# Patient Record
Sex: Female | Born: 1960
Health system: Southern US, Community
[De-identification: ages and names within clinical notes are randomized; demographics above are authoritative.]

## PROBLEM LIST (undated history)

## (undated) DIAGNOSIS — I10 Essential (primary) hypertension: Secondary | ICD-10-CM

## (undated) DIAGNOSIS — E785 Hyperlipidemia, unspecified: Secondary | ICD-10-CM

## (undated) DIAGNOSIS — N6320 Unspecified lump in the left breast, unspecified quadrant: Secondary | ICD-10-CM

## (undated) DIAGNOSIS — T7840XA Allergy, unspecified, initial encounter: Secondary | ICD-10-CM

## (undated) DIAGNOSIS — E119 Type 2 diabetes mellitus without complications: Secondary | ICD-10-CM

## (undated) HISTORY — PX: TOTAL ABDOMINAL HYSTERECTOMY: SHX209

## (undated) HISTORY — DX: Allergy, unspecified, initial encounter: T78.40XA

## (undated) HISTORY — DX: Hyperlipidemia, unspecified: E78.5

## (undated) HISTORY — PX: TUBAL LIGATION: SHX77

## (undated) HISTORY — DX: Essential (primary) hypertension: I10

---

## 2003-11-02 ENCOUNTER — Ambulatory Visit (HOSPITAL_COMMUNITY): Admission: RE | Admit: 2003-11-02 | Discharge: 2003-11-02 | Payer: Self-pay | Admitting: Obstetrics and Gynecology

## 2004-11-05 ENCOUNTER — Inpatient Hospital Stay (HOSPITAL_COMMUNITY): Admission: RE | Admit: 2004-11-05 | Discharge: 2004-11-07 | Payer: Self-pay | Admitting: Obstetrics and Gynecology

## 2004-12-04 ENCOUNTER — Ambulatory Visit (HOSPITAL_COMMUNITY): Admission: RE | Admit: 2004-12-04 | Discharge: 2004-12-04 | Payer: Self-pay | Admitting: Obstetrics and Gynecology

## 2006-01-16 ENCOUNTER — Ambulatory Visit (HOSPITAL_COMMUNITY): Admission: RE | Admit: 2006-01-16 | Discharge: 2006-01-16 | Payer: Self-pay | Admitting: Obstetrics and Gynecology

## 2006-08-02 ENCOUNTER — Emergency Department (HOSPITAL_COMMUNITY): Admission: EM | Admit: 2006-08-02 | Discharge: 2006-08-02 | Payer: Self-pay | Admitting: Emergency Medicine

## 2011-09-01 ENCOUNTER — Emergency Department (INDEPENDENT_AMBULATORY_CARE_PROVIDER_SITE_OTHER)
Admission: EM | Admit: 2011-09-01 | Discharge: 2011-09-01 | Disposition: A | Discharge status: Home / Self Care | Payer: Self-pay | Source: Home / Self Care

## 2011-09-01 ENCOUNTER — Encounter: Payer: Self-pay | Admitting: *Deleted

## 2011-09-01 DIAGNOSIS — J019 Acute sinusitis, unspecified: Secondary | ICD-10-CM

## 2011-09-01 MED ORDER — AMOXICILLIN 875 MG PO TABS: 875.0000 mg | ORAL_TABLET | Freq: Two times a day (BID) | ORAL | Status: AC

## 2011-09-01 NOTE — ED Provider Notes (Signed)
History     CSN: [id number]  Arrival date & time 09/01/11  1606   None     Chief Complaint  Patient presents with  . Cough  . Nasal Congestion  . Sore Throat    (Consider location/radiation/quality/duration/timing/severity/associated sxs/prior treatment) HPI Comments: Pt presents with c/o sinus pressure, post nasal drainage and nasal congestion x 3-4 days. The sinus pressure worsens with bending forward. Nasal mucus is green in color.  She has a mild cough - worse at night.  She has been taking otc decongestant medications with mild improvement only.  No dyspnea or wheezing.    No past medical history on file.  Past Surgical History  Procedure Date  . Tubal ligation   . Abdominal hysterectomy     No family history on file.  History  Substance Use Topics  . Smoking status: Never Smoker   . Smokeless tobacco: Not on file  . Alcohol Use: No    OB History    Grav Para Term Preterm Abortions TAB SAB Ect Mult Living                  Review of Systems  Constitutional: Negative for fever and chills.  HENT: Positive for congestion, rhinorrhea, postnasal drip and sinus pressure. Negative for ear pain and sore throat.   Respiratory: Negative for cough, shortness of breath and wheezing.   Cardiovascular: Negative for chest pain.    Allergies  Review of patient's allergies indicates no known allergies.  Home Medications   Current Outpatient Rx  Name Route Sig Dispense Refill  . DM-GUAIFENESIN ER 30-600 MG PO TB12 Oral Take 1 tablet by mouth every 12 (twelve) hours.      Marland Kitchen OVER THE COUNTER MEDICATION       . AMOXICILLIN 875 MG PO TABS Oral Take 1 tablet (875 mg total) by mouth 2 (two) times daily. 20 tablet 0    BP 165/90  Pulse 84  Temp(Src) 98.5 F (36.9 C) (Oral)  Resp 20  SpO2 100%  Physical Exam  Nursing note and vitals reviewed. Constitutional: She appears well-developed and well-nourished. No distress.  HENT:  Head: Normocephalic and atraumatic.    Right Ear: Tympanic membrane, external ear and ear canal normal.  Left Ear: Tympanic membrane, external ear and ear canal normal.  Nose: Mucosal edema and rhinorrhea present. Right sinus exhibits frontal sinus tenderness. Right sinus exhibits no maxillary sinus tenderness. Left sinus exhibits no maxillary sinus tenderness and no frontal sinus tenderness.  Mouth/Throat: Uvula is midline, oropharynx is clear and moist and mucous membranes are normal. No oropharyngeal exudate, posterior oropharyngeal edema or posterior oropharyngeal erythema.  Neck: Neck supple.  Cardiovascular: Normal rate, regular rhythm and normal heart sounds.   Pulmonary/Chest: Effort normal and breath sounds normal. No respiratory distress.  Lymphadenopathy:    She has no cervical adenopathy.  Neurological: She is alert.  Skin: Skin is warm and dry.  Psychiatric: She has a normal mood and affect.    ED Course  Procedures (including critical care time)  Labs Reviewed - No data to display No results found.   1. Acute sinusitis       MDM          Melody Comas, PA 09/01/11 1854

## 2011-09-01 NOTE — ED Notes (Signed)
Cough/congestion/sorethroat onset Monday taking otc meds without relief

## 2011-09-03 NOTE — ED Provider Notes (Signed)
Medical screening examination/treatment/procedure(s) were performed by non-physician practitioner and as supervising physician I was immediately available for consultation/collaboration.  Corrie Mckusick, MD 09/03/11 [phone]

## 2012-09-09 HISTORY — PX: BREAST CYST ASPIRATION: SHX578

## 2012-12-15 ENCOUNTER — Encounter (HOSPITAL_COMMUNITY): Payer: Self-pay | Admitting: *Deleted

## 2012-12-15 ENCOUNTER — Emergency Department (HOSPITAL_COMMUNITY)
Admission: EM | Admit: 2012-12-15 | Discharge: 2012-12-15 | Disposition: A | Discharge status: Home / Self Care | Payer: BC Managed Care – PPO | Source: Home / Self Care | Attending: Emergency Medicine | Admitting: Emergency Medicine

## 2012-12-15 DIAGNOSIS — J069 Acute upper respiratory infection, unspecified: Secondary | ICD-10-CM

## 2012-12-15 MED ORDER — FLUTICASONE PROPIONATE 50 MCG/ACT NA SUSP: 2.0000 | Freq: Every day | NASAL | Status: DC

## 2012-12-15 MED ORDER — AMOXICILLIN 500 MG PO CAPS: 1000.0000 mg | ORAL_CAPSULE | Freq: Three times a day (TID) | ORAL | Status: DC

## 2012-12-15 MED ORDER — FEXOFENADINE-PSEUDOEPHED ER 60-120 MG PO TB12: 1.0000 | ORAL_TABLET | Freq: Two times a day (BID) | ORAL | Status: DC

## 2012-12-15 MED ORDER — BENZONATATE 200 MG PO CAPS: 200.0000 mg | ORAL_CAPSULE | Freq: Three times a day (TID) | ORAL | Status: DC | PRN

## 2012-12-15 NOTE — ED Notes (Signed)
Nasal  stuffyness  With  Sneezing  Stuffy  Nose       Headache  Dry  Cough         Symptoms  X    4  Days   Pt  Sitting  Upright on exam table  She  Is  Speaking in  Complete  sentances  And  Is  In no  Acute  Distress       Skin is  Warm and  Dry  Pt is  Alert  and  Oriented

## 2012-12-15 NOTE — ED Provider Notes (Signed)
Chief Complaint:   Chief Complaint  Patient presents with  . Facial Pain    History of Present Illness:   Amber Boone is a 52 year old female who has had a four-day history of nasal congestion with clear rhinorrhea, headache, sinus pressure, popping or ears, sore throat, dry cough, and chills. She denies any fever, wheezing, chest pain, or GI symptoms. She has not been exposed to anything in particular.  Review of Systems:  Other than noted above, the patient denies any of the following symptoms: Systemic:  No fevers, chills, sweats, weight loss or gain, fatigue, or tiredness. Eye:  No redness or discharge. ENT:  No ear pain, drainage, headache, nasal congestion, drainage, sinus pressure, difficulty swallowing, or sore throat. Neck:  No neck pain or swollen glands. Lungs:  No cough, sputum production, hemoptysis, wheezing, chest tightness, shortness of breath or chest pain. GI:  No abdominal pain, nausea, vomiting or diarrhea.  PMFSH:  Past medical history, family history, social history, meds, and allergies were reviewed.   Physical Exam:   Vital signs:  BP 164/88  Pulse 72  Temp(Src) 99.4 F (37.4 C) (Oral)  Resp 18  SpO2 100% General:  Alert and oriented.  In no distress.  Skin warm and dry. Eye:  No conjunctival injection or drainage. Lids were normal. ENT:  TMs and canals were normal, without erythema or inflammation.  Nasal mucosa was congested, pale, and boggy, with clear drainage.  Mucous membranes were moist.  Pharynx was clear with no exudate or drainage.  There were no oral ulcerations or lesions. Neck:  Supple, no adenopathy, tenderness or mass. Lungs:  No respiratory distress.  Lungs were clear to auscultation, without wheezes, rales or rhonchi.  Breath sounds were clear and equal bilaterally.  Heart:  Regular rhythm, without gallops, murmers or rubs. Skin:  Clear, warm, and dry, without rash or lesions.  Assessment:  The encounter diagnosis was Viral upper  respiratory infection.  May have a component of allergies as well. Does not need antibiotics right now, but she was given a prescription to fill if she's not better in 3 or 4 days or if she develops any yellowish drainage.  Plan:   1.  The following meds were prescribed:   Discharge Medication List as of 12/15/2012  7:40 PM    START taking these medications   Details  amoxicillin (AMOXIL) 500 MG capsule Take 2 capsules (1,000 mg total) by mouth 3 (three) times daily., Starting 12/15/2012, Until Discontinued, Print    benzonatate (TESSALON) 200 MG capsule Take 1 capsule (200 mg total) by mouth 3 (three) times daily as needed for cough., Starting 12/15/2012, Until Discontinued, Normal    fexofenadine-pseudoephedrine (ALLEGRA-D) 60-120 MG per tablet Take 1 tablet by mouth every 12 (twelve) hours., Starting 12/15/2012, Until Discontinued, Normal    fluticasone (FLONASE) 50 MCG/ACT nasal spray Place 2 sprays into the nose daily., Starting 12/15/2012, Until Discontinued, Normal       The patient was told not to get the prescription for antibiotic filled unless her respiratory symptoms had persisted for more than 7 to 10 days.  2.  The patient was instructed in symptomatic care and handouts were given. 3.  The patient was told to return if becoming worse in any way, if no better in 3 or 4 days, and given some red flag symptoms such as fever, increasing pain, or vomiting that would indicate earlier return.      Reuben Likes, MD 12/15/12 2002

## 2012-12-30 ENCOUNTER — Emergency Department (INDEPENDENT_AMBULATORY_CARE_PROVIDER_SITE_OTHER): Payer: BC Managed Care – PPO

## 2012-12-30 ENCOUNTER — Emergency Department (HOSPITAL_COMMUNITY)
Admission: EM | Admit: 2012-12-30 | Discharge: 2012-12-30 | Disposition: A | Discharge status: Home / Self Care | Payer: BC Managed Care – PPO | Source: Home / Self Care | Attending: Family Medicine | Admitting: Family Medicine

## 2012-12-30 ENCOUNTER — Encounter (HOSPITAL_COMMUNITY): Payer: Self-pay | Admitting: Emergency Medicine

## 2012-12-30 DIAGNOSIS — J302 Other seasonal allergic rhinitis: Secondary | ICD-10-CM

## 2012-12-30 DIAGNOSIS — J309 Allergic rhinitis, unspecified: Secondary | ICD-10-CM

## 2012-12-30 MED ORDER — METHYLPREDNISOLONE ACETATE 40 MG/ML IJ SUSP
80.0000 mg | Freq: Once | INTRAMUSCULAR | Status: AC
  Administered 2012-12-30: 80 mg via INTRAMUSCULAR

## 2012-12-30 MED ORDER — TRIAMCINOLONE ACETONIDE 40 MG/ML IJ SUSP
40.0000 mg | Freq: Once | INTRAMUSCULAR | Status: AC
  Administered 2012-12-30: 40 mg via INTRAMUSCULAR

## 2012-12-30 MED ORDER — METHYLPREDNISOLONE 4 MG PO KIT: PACK | ORAL | Status: DC

## 2012-12-30 MED ORDER — TRIAMCINOLONE ACETONIDE 40 MG/ML IJ SUSP
INTRAMUSCULAR | Status: AC
  Filled 2012-12-30: qty 5

## 2012-12-30 MED ORDER — METHYLPREDNISOLONE ACETATE 80 MG/ML IJ SUSP
INTRAMUSCULAR | Status: AC
  Filled 2012-12-30: qty 1

## 2012-12-30 NOTE — ED Notes (Signed)
Pt c/o ongoing dry cough. Was seen and treated here on 12/15/12 and finished of the prescribed medicine on 12/26/12. No relief of symptoms. Generalized body aches. Feels pain in her chest that radiates to her back. No SOB. Pt is alert and oriented.

## 2012-12-30 NOTE — ED Provider Notes (Signed)
History     CSN: [id number]  Arrival date & time 12/30/12  1346   First MD Initiated Contact with Patient 12/30/12 1402      Chief Complaint  Patient presents with  . Cough    (Consider location/radiation/quality/duration/timing/severity/associated sxs/prior treatment) Patient is a 52 y.o. female presenting with cough. The history is provided by the patient.  Cough Cough characteristics:  Non-productive Severity:  Moderate Onset quality:  Gradual Duration:  15 days Timing:  Constant Progression:  Unchanged (seen 4/8 and dx with uri, given amox but sx continue with cough and sinus pressure.) Associated symptoms: headaches and rhinorrhea   Associated symptoms: no chills, no fever, no sore throat and no wheezing     History reviewed. No pertinent past medical history.  Past Surgical History  Procedure Laterality Date  . Tubal ligation    . Abdominal hysterectomy      No family history on file.  History  Substance Use Topics  . Smoking status: Never Smoker   . Smokeless tobacco: Not on file  . Alcohol Use: No    OB History   Grav Para Term Preterm Abortions TAB SAB Ect Mult Living                  Review of Systems  Constitutional: Negative for fever and chills.  HENT: Positive for congestion, rhinorrhea, postnasal drip and sinus pressure. Negative for sore throat.   Respiratory: Positive for cough. Negative for wheezing.   Cardiovascular: Negative.   Neurological: Positive for headaches.    Allergies  Review of patient's allergies indicates no known allergies.  Home Medications   Current Outpatient Rx  Name  Route  Sig  Dispense  Refill  . amoxicillin (AMOXIL) 500 MG capsule   Oral   Take 2 capsules (1,000 mg total) by mouth 3 (three) times daily.   60 capsule   0     Dispense as written.   . benzonatate (TESSALON) 200 MG capsule   Oral   Take 1 capsule (200 mg total) by mouth 3 (three) times daily as needed for cough.   30 capsule   0   .  fexofenadine-pseudoephedrine (ALLEGRA-D) 60-120 MG per tablet   Oral   Take 1 tablet by mouth every 12 (twelve) hours.   30 tablet   0   . fluticasone (FLONASE) 50 MCG/ACT nasal spray   Nasal   Place 2 sprays into the nose daily.   16 g   0   . OVER THE COUNTER MEDICATION                . methylPREDNISolone (MEDROL DOSEPAK) 4 MG tablet      follow package directions, start on thurs, take until finished.   21 tablet   0     BP 133/70  Pulse 72  Temp(Src) 98.2 F (36.8 C) (Oral)  Resp 16  SpO2 99%  Physical Exam  Nursing note and vitals reviewed. Constitutional: She is oriented to person, place, and time. She appears well-developed and well-nourished.  HENT:  Head: Normocephalic.  Right Ear: External ear normal.  Left Ear: External ear normal.  Mouth/Throat: Oropharynx is clear and moist.  Eyes: Conjunctivae are normal. Pupils are equal, round, and reactive to light.  Neck: Normal range of motion. Neck supple.  Cardiovascular: Normal rate, regular rhythm and normal heart sounds.   Pulmonary/Chest: Effort normal and breath sounds normal. She has no wheezes.  Lymphadenopathy:    She has no cervical adenopathy.  Neurological: She is alert and oriented to person, place, and time.  Skin: Skin is warm and dry.    ED Course  Procedures (including critical care time)  Labs Reviewed - No data to display Dg Sinuses Complete  12/30/2012  *RADIOLOGY REPORT*  Clinical Data: Pain and pressure.  Status post antibiotics.  PARANASAL SINUSES - COMPLETE 3 + VIEW  Comparison: None.  Findings: The paranasal sinuses are clear.  The mandible is intact and located.  The visualized skull is unremarkable.  IMPRESSION:  1.  The paranasal sinuses are clear.   Original Report Authenticated By: Marin Roberts, M.D.      1. Seasonal allergic reaction       MDM  X-rays reviewed and report per radiologist.         Linna Hoff, MD 12/30/12 [phone]

## 2013-06-03 ENCOUNTER — Ambulatory Visit: Payer: Self-pay | Admitting: Physician Assistant

## 2013-06-10 ENCOUNTER — Ambulatory Visit (INDEPENDENT_AMBULATORY_CARE_PROVIDER_SITE_OTHER): Payer: BC Managed Care – PPO | Admitting: Physician Assistant

## 2013-06-10 ENCOUNTER — Encounter: Payer: Self-pay | Admitting: Physician Assistant

## 2013-06-10 VITALS — BP 164/96 | HR 72 | Temp 98.3°F | Resp 18 | Ht 65.5 in | Wt 154.0 lb

## 2013-06-10 DIAGNOSIS — Z1211 Encounter for screening for malignant neoplasm of colon: Secondary | ICD-10-CM

## 2013-06-10 DIAGNOSIS — Z1239 Encounter for other screening for malignant neoplasm of breast: Secondary | ICD-10-CM

## 2013-06-10 DIAGNOSIS — I10 Essential (primary) hypertension: Secondary | ICD-10-CM | POA: Insufficient documentation

## 2013-06-10 DIAGNOSIS — Z0000 Encounter for general adult medical examination without abnormal findings: Secondary | ICD-10-CM

## 2013-06-10 DIAGNOSIS — Z23 Encounter for immunization: Secondary | ICD-10-CM

## 2013-06-10 MED ORDER — BENAZEPRIL-HYDROCHLOROTHIAZIDE 10-12.5 MG PO TABS: 1.0000 | ORAL_TABLET | Freq: Every day | ORAL | Status: DC

## 2013-06-10 NOTE — Addendum Note (Signed)
Addended by: Donne Anon on: 06/10/2013 05:05 PM   Modules accepted: Orders

## 2013-06-10 NOTE — Progress Notes (Signed)
Patient ID: Amber Boone MRN: [id number], DOB: 09/09/1961, 52 y.o. Date of Encounter: 06/10/2013,   Chief Complaint: Physical (CPE)  HPI: 52 y.o. y/o AA female  here for CPE.   As well she is a new patient to our office and is here to establish care.  She says that she saw Dr. Emelda Fear for her GYN care in the past. She thinks that her last visit with him was about 2 years ago. Otherwise she has been going to just an urgent care for any other needed medical care. She does not think she has had her cholesterol or any other screening labs done in many years.  She has no known medical problems. He reports that she has had uterine fibroids. This was the reason that she had a complete hysterectomy. No other surgeries other than complete hysterectomy and tubal ligation.   Review of Systems: Consitutional: No fever, chills, fatigue, night sweats, lymphadenopathy. No significant/unexplained weight changes. Eyes: No visual changes, eye redness, or discharge. ENT/Mouth: No ear pain, sore throat, nasal drainage, or sinus pain. Cardiovascular: No chest pressure,heaviness, tightness or squeezing, even with exertion. No increased shortness of breath or dyspnea on exertion.No palpitations, edema, orthopnea, PND. Respiratory: No cough, hemoptysis, SOB, or wheezing. Gastrointestinal: No anorexia, dysphagia, reflux, pain, nausea, vomiting, hematemesis, diarrhea, constipation, BRBPR, or melena. Breast: No mass, nodules, bulging, or retraction. No skin changes or inflammation. No nipple discharge. No lymphadenopathy. Genitourinary: No dysuria, hematuria, incontinence, vaginal discharge, pruritis, burning, abnormal bleeding, or pain. Musculoskeletal: No decreased ROM, No joint pain or swelling. No significant pain in neck, back, or extremities. Skin: No rash, pruritis, or concerning lesions. Neurological: No headache, dizziness, syncope, seizures, tremors, memory loss, coordination problems, or  paresthesias. Psychological: No anxiety, depression, hallucinations, SI/HI. Endocrine: No polydipsia, polyphagia, polyuria, or known diabetes.No increased fatigue. No palpitations/rapid heart rate. No significant/unexplained weight change. All other systems were reviewed and are otherwise negative.  History reviewed. No pertinent past medical history.   Past Surgical History  Procedure Laterality Date  . Tubal ligation    . Total abdominal hysterectomy      Secondary to fibroids-No cancer    Home Meds:  She takes a multivitamin each day. No other medications.   Allergies: No Known Allergies  History   Social History  . Marital Status: Single    Spouse Name: N/A    Number of Children: N/A  . Years of Education: N/A   Occupational History  . Not on file.   Social History Main Topics  . Smoking status: Never Smoker   . Smokeless tobacco: Never Used  . Alcohol Use: No  . Drug Use: No  . Sexual Activity: No   Other Topics Concern  . 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   3 children: Ages 35,30, 24 --all out of house    Family History  Problem Relation Age of Onset  . Cancer Father     ? Type    Physical Exam: Blood pressure 164/96, pulse 72, temperature 98.3 F (36.8 C), temperature source Oral, resp. rate 18, height 5' 5.5" (1.664 m), weight 154 lb (69.854 kg)., Body mass index is 25.23 kg/(m^2). General: Well developed, well nourished, African American female. in no acute distress. HEENT: Normocephalic, atraumatic. Conjunctiva pink, sclera non-icteric. Pupils 2 mm constricting to 1 mm, round, regular, and equally reactive to light and accomodation. EOMI.  Internal auditory canal clear. TMs with good cone of light and without pathology. Nasal mucosa pink. Nares are without discharge. No sinus tenderness. Oral mucosa pink.  Pharynx without exudate.   Neck:  Supple. Trachea midline. No thyromegaly. Full ROM. No lymphadenopathy.No Carotid Bruits. Lungs: Clear to auscultation bilaterally without wheezes, rales, or rhonchi. Breathing is of normal effort and unlabored. Cardiovascular: RRR with S1 S2. No murmurs, rubs, or gallops. Distal pulses 2+ symmetrically. No carotid or abdominal bruits. Breast: Symmetrical. No masses. Nipples without discharge. Abdomen: Soft, non-tender, non-distended with normoactive bowel sounds. No hepatosplenomegaly or masses. No rebound/guarding. No CVA tenderness. No hernias.  Genitourinary:  External genitalia without lesions. Vaginal mucosa pink.No discharge present. Mucosa is normal. A manual exam is normal with no mass. Musculoskeletal: Full range of motion and 5/5 strength throughout. Without swelling, atrophy, tenderness, crepitus, or warmth. Extremities without clubbing, cyanosis, or edema. Calves supple. Skin: Warm and moist without erythema, ecchymosis, wounds, or rash. Neuro: A+Ox3. CN II-XII grossly intact. Moves all extremities spontaneously. Full sensation throughout. Normal gait. DTR 2+ throughout upper and lower extremities. Finger to nose intact. Psych:  Responds to questions appropriately with a normal affect.   Assessment/Plan:  52 y.o.  y/o female here for CPE 1. Visit for preventive health examination  A. Screening Labs:  She is not fasting. She says that she can return to the clinic fasting and would rather come into our labs done fasting at once rather than doing part of the labs today. Since we are starting blood pressure medication she is going to need followup office visit in 2 weeks, she can schedule that visit for early morning and come fasting so we can do the lab work at the time of that visit. However she is been told to take her blood pressure medication with water that morning but nothing else by mouth before the visit. - CBC with Differential; Future - COMPLETE METABOLIC PANEL WITH GFR;  Future - Lipid panel; Future - TSH; Future - Vit D  25 hydroxy (rtn osteoporosis monitoring); Future  B. No pap smear indicated since she has had hysterectomy secondary to fibroids and no history of cancer.  C. screening mammogram: Patient says her last mammogram was around the time of her last visit with Dr. Emelda Fear. I will schedule as below.  D. greening colonoscopy: She has never had this. I have discussed risk and benefits of this and she is agreeable to proceed. I will refer her to GI.  E. Immunizations:  She has had no tetanus in over 10 years. She is agreeable to have this now.  She is agreeable to get influenza vaccine today. Give now.  2. Essential hypertension, benign - COMPLETE METABOLIC PANEL WITH GFR; Future - benazepril-hydrochlorthiazide (LOTENSIN HCT) 10-12.5 MG per tablet; Take 1 tablet by mouth daily.  Dispense: 30 tablet; Refill: 0 Starting blood pressure medication daily. Schedule followup office visit in 2 weeks to recheck blood pressure and bmet.  3. Screening for colorectal cancer - Ambulatory referral to Gastroenterology  4. Breast cancer screening - MM Digital Screening; Future  Followup office visit in 2 weeks to recheck blood pressure and check fasting labs.  [address] [place], [place], [facility] 06/10/2013 4:07 PM

## 2013-06-18 ENCOUNTER — Encounter: Payer: Self-pay | Admitting: Internal Medicine

## 2013-06-24 ENCOUNTER — Ambulatory Visit (INDEPENDENT_AMBULATORY_CARE_PROVIDER_SITE_OTHER): Payer: BC Managed Care – PPO | Admitting: Physician Assistant

## 2013-06-24 ENCOUNTER — Encounter: Payer: Self-pay | Admitting: Physician Assistant

## 2013-06-24 ENCOUNTER — Ambulatory Visit (AMBULATORY_SURGERY_CENTER): Payer: Self-pay | Admitting: *Deleted

## 2013-06-24 VITALS — Ht 67.5 in | Wt 159.6 lb

## 2013-06-24 VITALS — BP 124/84 | HR 68 | Temp 98.1°F | Resp 18 | Wt 156.0 lb

## 2013-06-24 DIAGNOSIS — Z0000 Encounter for general adult medical examination without abnormal findings: Secondary | ICD-10-CM

## 2013-06-24 DIAGNOSIS — I10 Essential (primary) hypertension: Secondary | ICD-10-CM

## 2013-06-24 DIAGNOSIS — Z1211 Encounter for screening for malignant neoplasm of colon: Secondary | ICD-10-CM

## 2013-06-24 LAB — CBC WITH DIFFERENTIAL/PLATELET
Basophils Relative: 0 % (ref 0–1)
Eosinophils Absolute: 0.1 10*3/uL (ref 0.0–0.7)
Eosinophils Relative: 1 % (ref 0–5)
Lymphs Abs: 2.4 10*3/uL (ref 0.7–4.0)
MCH: 26.9 pg (ref 26.0–34.0)
MCHC: 33.7 g/dL (ref 30.0–36.0)
MCV: 79.7 fL (ref 78.0–100.0)
Neutrophils Relative %: 52 % (ref 43–77)
Platelets: 292 10*3/uL (ref 150–400)
RBC: 5.13 MIL/uL — ABNORMAL HIGH (ref 3.87–5.11)

## 2013-06-24 LAB — COMPLETE METABOLIC PANEL WITH GFR
ALT: 18 U/L (ref 0–35)
Alkaline Phosphatase: 90 U/L (ref 39–117)
CO2: 27 mEq/L (ref 19–32)
Creat: 0.95 mg/dL (ref 0.50–1.10)
GFR, Est African American: 80 mL/min
GFR, Est Non African American: 69 mL/min
Glucose, Bld: 98 mg/dL (ref 70–99)
Sodium: 140 mEq/L (ref 135–145)
Total Bilirubin: 0.4 mg/dL (ref 0.3–1.2)

## 2013-06-24 LAB — LIPID PANEL
HDL: 65 mg/dL (ref >39)
Total CHOL/HDL Ratio: 3.1 Ratio
Triglycerides: 59 mg/dL (ref <150)

## 2013-06-24 MED ORDER — NA SULFATE-K SULFATE-MG SULF 17.5-3.13-1.6 GM/177ML PO SOLN: 1.0000 | Freq: Once | ORAL | Status: DC

## 2013-06-24 MED ORDER — BENAZEPRIL-HYDROCHLOROTHIAZIDE 10-12.5 MG PO TABS: 1.0000 | ORAL_TABLET | Freq: Every day | ORAL | Status: DC

## 2013-06-24 NOTE — Progress Notes (Signed)
   Patient ID: Tariah Transue MRN: [id number], DOB: [birth year], 52 y.o. Date of Encounter: 06/24/2013, 8:13 AM    Chief Complaint:  Chief Complaint  Patient presents with  . 2 week follow up    BP     HPI: 52 y.o. year old AA female is here to follow up for hypertension and a cough that is fasting today to do fasting labs as part of her physical exam that was done 06/10/13.  She was seen by me on 06/10/13 as a new patient to our office. At that time we did a complete physical exam. See that note for details. At that visit she was found to have hypertension with a blood pressure of 164/96. I prescribed Benazepril HCTZ 10/12.5 mg daily. Today she reports that she has been taking this daily as directed. She is having no adverse effects. She did take a dose this morning with water. He is fasting so that she can do screening labs. She was not fasting today of her complete physical exam on 06/10/13.     Home Meds: See attached medication section for any medications that were entered at today's visit. The computer does not put those onto this list.The following list is a list of meds entered prior to today's visit.   Current Outpatient Prescriptions on File Prior to Visit  Medication Sig Dispense Refill  . Multiple Vitamins-Minerals (MULTIVITAMIN PO) Take by mouth.       Benazepril HCTZ 10/12.5 mg 1 by mouth daily.     Allergies: No Known Allergies    Review of Systems: See HPI for pertinent ROS. All other ROS negative.    Physical Exam: Blood pressure 124/84, pulse 68, temperature 98.1 F (36.7 C), temperature source Oral, resp. rate 18, weight 156 lb (70.761 kg)., Body mass index is 25.56 kg/(m^2). General: Well-nourished well-developed after American female.  Appears in no acute distress.  Neck: Supple. No thyromegaly. No lymphadenopathy. No carotid bruits. Lungs: Clear bilaterally to auscultation without wheezes, rales, or rhonchi. Breathing is unlabored. Heart: Regular rhythm. No  murmurs, rubs, or gallops. Msk:  Strength and tone normal for age. Extremities/Skin: Warm and dry.  No edema. Neuro: Alert and oriented X 3. Moves all extremities spontaneously. Gait is normal. CNII-XII grossly in tact. Psych:  Responds to questions appropriately with a normal affect.     ASSESSMENT AND PLAN:  52 y.o. year old female with  1. Hypertension Blood pressure is at goal. Continue current medication. I have sent in refills to last 6 months. Will check BMET. She will need followup office visit in 6 months.  2. Essential hypertension, benign - benazepril-hydrochlorthiazide (LOTENSIN HCT) 10-12.5 MG per tablet; Take 1 tablet by mouth daily.  Dispense: 30 tablet; Refill: 5 - COMPLETE METABOLIC PANEL WITH GFR  3. Visit for preventive health examination She is fasting today to obtain screening labs from her recent CPE. - CBC with Differential - COMPLETE METABOLIC PANEL WITH GFR - Lipid panel - TSH - Vit D  25 hydroxy (rtn osteoporosis monitoring)   Signed, [address] [place], [place], [facility] 06/24/2013 8:13 AM

## 2013-06-24 NOTE — Progress Notes (Signed)
No egg or soy allergy. ewm No problems with past sedation, no issues with intubation. ewm No previous colonoscopy. ewm No home 02 or cpap use at home. ewm No e mail for emmi video. ewm

## 2013-06-28 ENCOUNTER — Encounter: Payer: Self-pay | Admitting: Family Medicine

## 2013-06-28 ENCOUNTER — Encounter: Payer: Self-pay | Admitting: Internal Medicine

## 2013-07-01 ENCOUNTER — Ambulatory Visit (AMBULATORY_SURGERY_CENTER): Payer: BC Managed Care – PPO | Admitting: Internal Medicine

## 2013-07-01 ENCOUNTER — Encounter: Payer: Self-pay | Admitting: Internal Medicine

## 2013-07-01 VITALS — BP 152/71 | HR 49 | Temp 97.4°F | Resp 14 | Ht 67.0 in | Wt 159.0 lb

## 2013-07-01 DIAGNOSIS — Z1211 Encounter for screening for malignant neoplasm of colon: Secondary | ICD-10-CM

## 2013-07-01 MED ORDER — SODIUM CHLORIDE 0.9 % IV SOLN: 500.0000 mL | INTRAVENOUS | Status: DC

## 2013-07-01 NOTE — Patient Instructions (Addendum)
The colonoscopy was normal and the prep was great.  Next routine colonoscopy in 10 years - 2024.  I appreciate the opportunity to care for you. Shajuan Musso E. Ariyon Mittleman, MD, FACG YOU HAD AN ENDOSCOPIC PROCEDURE TODAY AT THE [facility]: Refer to the procedure report that was given to you for any specific questions about what was found during the examination.  If the procedure report does not answer your questions, please call your gastroenterologist to clarify.  If you requested that your care partner not be given the details of your procedure findings, then the procedure report has been included in a sealed envelope for you to review at your convenience later.  YOU SHOULD EXPECT: Some feelings of bloating in the abdomen. Passage of more gas than usual.  Walking can help get rid of the air that was put into your GI tract during the procedure and reduce the bloating. If you had a lower endoscopy (such as a colonoscopy or flexible sigmoidoscopy) you may notice spotting of blood in your stool or on the toilet paper. If you underwent a bowel prep for your procedure, then you may not have a normal bowel movement for a few days.  DIET: Your first meal following the procedure should be a light meal and then it is ok to progress to your normal diet.  A half-sandwich or bowl of soup is an example of a good first meal.  Heavy or fried foods are harder to digest and may make you feel nauseous or bloated.  Likewise meals heavy in dairy and vegetables can cause extra gas to form and this can also increase the bloating.  Drink plenty of fluids but you should avoid alcoholic beverages for 24 hours.  ACTIVITY: Your care partner should take you home directly after the procedure.  You should plan to take it easy, moving slowly for the rest of the day.  You can resume normal activity the day after the procedure however you should NOT DRIVE or use heavy machinery for 24 hours (because of the sedation medicines used  during the test).    SYMPTOMS TO REPORT IMMEDIATELY: A gastroenterologist can be reached at any hour.  During normal business hours, 8:30 AM to 5:00 PM Monday through Friday, call [phone].  After hours and on weekends, please call the GI answering service at [phone] who will take a message and have the physician on call contact you.   Following lower endoscopy (colonoscopy or flexible sigmoidoscopy):  Excessive amounts of blood in the stool  Significant tenderness or worsening of abdominal pains  Swelling of the abdomen that is new, acute  Fever of 100F or higher   FOLLOW UP: If any biopsies were taken you will be contacted by phone or by letter within the next 1-3 weeks.  Call your gastroenterologist if you have not heard about the biopsies in 3 weeks.  Our staff will call the home number listed on your records the next business day following your procedure to check on you and address any questions or concerns that you may have at that time regarding the information given to you following your procedure. This is a courtesy call and so if there is no answer at the home number and we have not heard from you through the emergency physician on call, we will assume that you have returned to your regular daily activities without incident.  SIGNATURES/CONFIDENTIALITY: You and/or your care partner have signed paperwork which will be entered into your electronic   medical record.  These signatures attest to the fact that that the information above on your After Visit Summary has been reviewed and is understood.  Full responsibility of the confidentiality of this discharge information lies with you and/or your care-partner.  

## 2013-07-01 NOTE — Op Note (Signed)
[facility] [address] [place], [address]   COLONOSCOPY PROCEDURE REPORT  PATIENT: Amber Boone, Amber Boone  MR#: [id number] BIRTHDATE: 28-Feb-1961 , 52  yrs. old GENDER: Female ENDOSCOPIST: Iva Boop, MD, [facility] REFERRED XB:JYNW Dionicio Stall, PA-C PROCEDURE DATE:  07/01/2013 PROCEDURE:   Colonoscopy, screening First Screening Colonoscopy - Avg.  risk and is 50 yrs.  old or older Yes.  Prior Negative Screening - Now for repeat screening. N/A  History of Adenoma - Now for follow-up colonoscopy & has been > or = to 3 yrs.  N/A  Polyps Removed Today? No.  Recommend repeat exam, <10 yrs? No. ASA CLASS:   Class II INDICATIONS:average risk screening and first colonoscopy. MEDICATIONS: Propofol (Diprivan) 280 mg IV, MAC sedation, administered by CRNA, and These medications were titrated to patient response per physician's verbal order  DESCRIPTION OF PROCEDURE:   After the risks benefits and alternatives of the procedure were thoroughly explained, informed consent was obtained.  A digital rectal exam revealed no abnormalities of the rectum.   The LB [id number]-H190 [id number]  endoscope was introduced through the anus and advanced to the cecum, which was identified by both the appendix and ileocecal valve. No adverse events experienced.   The quality of the prep was excellent using Suprep  The instrument was then slowly withdrawn as the colon was fully examined.      COLON FINDINGS: A normal appearing cecum, ileocecal valve, and appendiceal orifice were identified.  The ascending, hepatic flexure, transverse, splenic flexure, descending, sigmoid colon and rectum appeared unremarkable.  No polyps or cancers were seen.   A right colon retroflexion was performed.  Retroflexed views revealed no abnormalities. The time to cecum=2 minutes 09 seconds. Withdrawal time=10 minutes 58 seconds.  The scope was withdrawn and the procedure completed. COMPLICATIONS: There were no  complications.  ENDOSCOPIC IMPRESSION: Normal colonoscopy - excellent prep - first colonoscopy  RECOMMENDATIONS: Repeat routine colonoscopy 10 years - 2024   eSigned:  Iva Boop, MD, [facility] 07/01/2013 10:36 AM   cc: Frazier Richards MD and The Patient

## 2013-07-01 NOTE — Progress Notes (Signed)
Patient did not experience any of the following events: a burn prior to discharge; a fall within the facility; wrong site/side/patient/procedure/implant event; or a hospital transfer or hospital admission upon discharge from the facility. (G8907) Patient did not have preoperative order for IV antibiotic SSI prophylaxis. (G8918)  

## 2013-07-01 NOTE — Progress Notes (Signed)
Report to pacu rn, vss, bbs=clear 

## 2013-07-02 ENCOUNTER — Telehealth: Payer: Self-pay | Admitting: *Deleted

## 2013-07-02 NOTE — Telephone Encounter (Signed)
Number identifier, left message, follow-up  

## 2013-07-09 ENCOUNTER — Ambulatory Visit
Admission: RE | Admit: 2013-07-09 | Discharge: 2013-07-09 | Disposition: A | Discharge status: Home / Self Care | Payer: BC Managed Care – PPO | Source: Ambulatory Visit | Attending: Physician Assistant | Admitting: Physician Assistant

## 2013-07-09 DIAGNOSIS — Z1239 Encounter for other screening for malignant neoplasm of breast: Secondary | ICD-10-CM

## 2013-07-09 DIAGNOSIS — Z0000 Encounter for general adult medical examination without abnormal findings: Secondary | ICD-10-CM

## 2013-07-19 ENCOUNTER — Other Ambulatory Visit: Payer: Self-pay | Admitting: Physician Assistant

## 2013-07-19 DIAGNOSIS — R928 Other abnormal and inconclusive findings on diagnostic imaging of breast: Secondary | ICD-10-CM

## 2013-07-23 ENCOUNTER — Other Ambulatory Visit: Payer: BC Managed Care – PPO

## 2013-07-23 ENCOUNTER — Ambulatory Visit
Admission: RE | Admit: 2013-07-23 | Discharge: 2013-07-23 | Disposition: A | Discharge status: Home / Self Care | Payer: BC Managed Care – PPO | Source: Ambulatory Visit | Attending: Physician Assistant | Admitting: Physician Assistant

## 2013-07-23 ENCOUNTER — Other Ambulatory Visit: Payer: Self-pay | Admitting: Physician Assistant

## 2013-07-23 DIAGNOSIS — R928 Other abnormal and inconclusive findings on diagnostic imaging of breast: Secondary | ICD-10-CM

## 2013-07-23 DIAGNOSIS — N6001 Solitary cyst of right breast: Secondary | ICD-10-CM

## 2013-07-27 ENCOUNTER — Ambulatory Visit: Payer: BC Managed Care – PPO | Admitting: Family Medicine

## 2013-07-27 VITALS — BP 140/90

## 2013-07-27 DIAGNOSIS — I10 Essential (primary) hypertension: Secondary | ICD-10-CM

## 2013-07-27 NOTE — Patient Instructions (Signed)
Pt had a dizzy spell this morning while getting out of bed. She sat on the side of the bed for a minute and then was able to get up and move freely without any dizziness. She wanted her bp checked and she has not taken her BP meds today cause she thought it may be coming from them. I informed pt that it is hard to tell if her dizziness was from BP or maybe sinus problems as she is having some allergies as well. Told her to be sure to sit up in bed before getting up if she feels dizzy and if it continues NTBS.

## 2013-12-23 ENCOUNTER — Ambulatory Visit: Payer: BC Managed Care – PPO | Admitting: Physician Assistant

## 2014-01-07 ENCOUNTER — Other Ambulatory Visit: Payer: Self-pay | Admitting: Physician Assistant

## 2014-01-07 ENCOUNTER — Encounter: Payer: Self-pay | Admitting: Family Medicine

## 2014-01-07 DIAGNOSIS — I10 Essential (primary) hypertension: Secondary | ICD-10-CM

## 2014-01-07 NOTE — Telephone Encounter (Signed)
Medication refill for one time only.  Patient needs to be seen.  Letter sent for patient to call and schedule 

## 2014-01-19 ENCOUNTER — Ambulatory Visit: Payer: BC Managed Care – PPO | Admitting: Physician Assistant

## 2014-01-20 ENCOUNTER — Encounter: Payer: Self-pay | Admitting: Physician Assistant

## 2014-01-20 ENCOUNTER — Ambulatory Visit (INDEPENDENT_AMBULATORY_CARE_PROVIDER_SITE_OTHER): Payer: BC Managed Care – PPO | Admitting: Physician Assistant

## 2014-01-20 VITALS — BP 156/96 | HR 72 | Temp 97.6°F | Resp 18 | Wt 165.0 lb

## 2014-01-20 DIAGNOSIS — J309 Allergic rhinitis, unspecified: Secondary | ICD-10-CM

## 2014-01-20 DIAGNOSIS — I10 Essential (primary) hypertension: Secondary | ICD-10-CM

## 2014-01-20 MED ORDER — CETIRIZINE HCL 10 MG PO TABS: 10.0000 mg | ORAL_TABLET | Freq: Every day | ORAL | Status: DC

## 2014-01-20 MED ORDER — BENAZEPRIL-HYDROCHLOROTHIAZIDE 20-12.5 MG PO TABS: 1.0000 | ORAL_TABLET | Freq: Every day | ORAL | Status: DC

## 2014-01-20 NOTE — Progress Notes (Signed)
Patient ID: Amber Boone MRN: [id number], DOB: [birth year], 53 y.o. Date of Encounter: 01/20/2014,   Chief Complaint: F/U hypertension  HPI: 53 y.o. y/o AA female  here for routine f/u of HTN.   She was initially seen by me on 06/10/2013 for complete physical exam and also as a new patient to our office to establish care.   At that time she reported that she saw Dr. Glo Herring for her GYN care in the past. She thought that her last visit with him was about 2 prior to the time she saw me for that visit in 06/2013. Otherwise she had been going to just an urgent care for any other needed medical care. She did not think she has had her cholesterol or any other screening labs done in many years.  She had no known medical problems. She reported that she had h/o uterine fibroids. This was the reason that she had a complete hysterectomy. No other surgeries other than complete hysterectomy and tubal ligation.  At that visit she was found to have hypertension and was started on medication. She had a followup office visit 06/24/13 to recheck blood pressure and lab work on  Medication.  Today she presents for 6 month visit for followup. She is taking the benazepril HCTZ daily as directed. She has not had any blood pressure checks anywhere since last visit here.  Today she does report that she has been having some nonproductive cough. Says that this is never productive of any phlegm. No congestion in her chest. However through the visit she is holding a tissue to her nose. She says that for the past 2 weeks she has been having episodes of her nose suddenly starting to run-- this is clear watery drainage. Says that at the same time that this drainage runs out of her nose is when she usually has the coughing spells. Has had some very mild irritation to her eyes but no significant itchy watery eyes.   Review of Systems: Consitutional: No fever, chills, fatigue, night sweats, lymphadenopathy. No  significant/unexplained weight changes. Eyes: No visual changes, eye redness, or discharge. ENT/Mouth: No ear pain, sore throat, nasal drainage, or sinus pain. Cardiovascular: No chest pressure,heaviness, tightness or squeezing, even with exertion. No increased shortness of breath or dyspnea on exertion.No palpitations, edema, orthopnea, PND. Respiratory: No cough, hemoptysis, SOB, or wheezing. Gastrointestinal: No anorexia, dysphagia, reflux, pain, nausea, vomiting, hematemesis, diarrhea, constipation, BRBPR, or melena. Breast: No mass, nodules, bulging, or retraction. No skin changes or inflammation. No nipple discharge. No lymphadenopathy. Genitourinary: No dysuria, hematuria, incontinence, vaginal discharge, pruritis, burning, abnormal bleeding, or pain. Musculoskeletal: No decreased ROM, No joint pain or swelling. No significant pain in neck, back, or extremities. Skin: No rash, pruritis, or concerning lesions. Neurological: No headache, dizziness, syncope, seizures, tremors, memory loss, coordination problems, or paresthesias. Psychological: No anxiety, depression, hallucinations, SI/HI. Endocrine: No polydipsia, polyphagia, polyuria, or known diabetes.No increased fatigue. No palpitations/rapid heart rate. No significant/unexplained weight change. All other systems were reviewed and are otherwise negative.  Past Medical History  Diagnosis Date  . Hypertension      Past Surgical History  Procedure Laterality Date  . Tubal ligation    . Total abdominal hysterectomy      Secondary to fibroids-No cancer    Home Meds:  See attached medication section for correct medicines as the computer is not bringing them in!!!--even with .med !!!!    Allergies: No Known Allergies  History   Social History  . Marital  Status: Single    Spouse Name: N/A    Number of Children: N/A  . Years of Education: N/A   Occupational History  . Not on file.   Social History Main Topics  . Smoking  status: Never Smoker   . Smokeless tobacco: Never Used  . Alcohol Use: No  . Drug Use: No  . Sexual Activity: No   Other Topics Concern  . 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   3 children: Ages 44,30, 20 --all out of house    Family History  Problem Relation Age of Onset  . Cancer Father     ? Type  . Colon cancer Neg Hx     Physical Exam: Blood pressure 156/96, pulse 72, temperature 97.6 F (36.4 C), temperature source Oral, resp. rate 18, weight 165 lb (74.844 kg)., Body mass index is 25.84 kg/(m^2). General: Well developed, well nourished, African American female. in no acute distress. HEENT: Normocephalic, atraumatic. Conjunctiva pink, sclera non-icteric. Pupils 2 mm constricting to 1 mm, round, regular, and equally reactive to light and accomodation. EOMI. Internal auditory canal clear. TMs with good cone of light and without pathology. Nasal mucosa pink. Nares are without discharge. No sinus tenderness. Oral mucosa pink.  Pharynx without exudate.   Neck: Supple. Trachea midline. No thyromegaly. Full ROM. No lymphadenopathy.No Carotid Bruits. Lungs: Clear to auscultation bilaterally without wheezes, rales, or rhonchi. Breathing is of normal effort and unlabored. Cardiovascular: RRR with S1 S2. No murmurs, rubs, or gallops. Distal pulses 2+ symmetrically. No carotid or abdominal bruits. Abdomen: Soft, non-tender, non-distended with normoactive bowel sounds. No hepatosplenomegaly or masses. No rebound/guarding. No CVA tenderness. No hernias.  Musculoskeletal: Full range of motion and 5/5 strength throughout. Skin: Warm and moist.No rash. Neuro: A+Ox3. CN II-XII grossly intact. Moves all extremities spontaneously. Full sensation throughout. Normal gait. Psych:  Responds to questions appropriately with a normal affect.   Assessment/Plan:  53 y.o. y/o female here  for   1. Hypertension Repeat blood pressure by me on the left I get 140/92 on 150/98. Blood pressure is suboptimal. Increase benazepril HCT from 10/12  To  20/12.5. Patient says that she can and will return for followup in 2 weeks. Therefore, will  wait to repeat any lab work at that time. Return to  clinic in 2 weeks to recheck blood pressure and BMET on medication. - benazepril-hydrochlorthiazide (LOTENSIN HCT) 20-12.5 MG per tablet; Take 1 tablet by mouth daily.  Dispense: 30 tablet; Refill: 0  2. Allergic rhinitis As well, I  think her current symptoms are secondary to allergic rhinitis. She states that she currently is using no medicines or allergies. She is to start Zyrtec 10 mg daily we'll recheck this at her followup visit in 2 weeks as well. - cetirizine (ZYRTEC) 10 MG tablet; Take 1 tablet (10 mg total) by mouth daily.  Dispense: 30 tablet; Refill: 11   3. PREVENTIVE CARE--- She had complete physical exam on 06/10/2013.  A. Screening Labs:  Returned for fasting labs on 06/24/13.   B. No pap smear indicated since she has had hysterectomy secondary to fibroids and no history of cancer.  C. screening mammogram: Patient says her last mammogram was around the time of her last visit with Dr. Glo Herring. I will schedule as below.--TODAY she reports that she did have her mammogram. Was negative.  D. Sreening colonoscopy: She  has never had this. I have discussed risk and benefits of this and she is agreeable to proceed. I will refer her to GI.TODAY--reports she did have a colonoscopy.  E. Immunizations:  She has had no tetanus in over 10 years. She is agreeable to have this now.--GIVEN AT CPE 06/10/2013  She is agreeable to get influenza vaccine today. Give now.--GIVEN AT CPE 06/10/2013   Screening for colorectal cancer - Ambulatory referral to Gastroenterology   Breast cancer screening - MM Digital Screening; Future  Followup office visit in 2 weeks to recheck blood pressure and BMET  and to f/u allergy symptoms.   [address] [place], [place], [facility] 01/20/2014 4:31 PM

## 2014-02-02 ENCOUNTER — Encounter: Payer: Self-pay | Admitting: Physician Assistant

## 2014-02-02 ENCOUNTER — Ambulatory Visit (INDEPENDENT_AMBULATORY_CARE_PROVIDER_SITE_OTHER): Payer: BC Managed Care – PPO | Admitting: Physician Assistant

## 2014-02-02 VITALS — BP 128/82 | HR 68 | Temp 98.4°F | Resp 18 | Wt 162.0 lb

## 2014-02-02 DIAGNOSIS — A499 Bacterial infection, unspecified: Secondary | ICD-10-CM

## 2014-02-02 DIAGNOSIS — B9689 Other specified bacterial agents as the cause of diseases classified elsewhere: Secondary | ICD-10-CM

## 2014-02-02 DIAGNOSIS — J988 Other specified respiratory disorders: Secondary | ICD-10-CM

## 2014-02-02 DIAGNOSIS — I10 Essential (primary) hypertension: Secondary | ICD-10-CM

## 2014-02-02 MED ORDER — AZITHROMYCIN 250 MG PO TABS: ORAL_TABLET | ORAL | Status: DC

## 2014-02-02 MED ORDER — BENAZEPRIL-HYDROCHLOROTHIAZIDE 20-12.5 MG PO TABS: 1.0000 | ORAL_TABLET | Freq: Every day | ORAL | Status: DC

## 2014-02-02 NOTE — Progress Notes (Signed)
Patient ID: Amber Boone MRN: [id number], DOB: Dec 02, 1960, 53 y.o. Date of Encounter: 02/02/2014,   Chief Complaint: F/U hypertension  HPI: 53 y.o. y/o AA female  here for routine f/u of HTN.   She was initially seen by me on 06/10/2013 for complete physical exam and also as a new patient to our office to establish care.   At that time she reported that she saw Dr. Glo Herring for her GYN care in the past. She thought that her last visit with him was about 2 prior to the time she saw me for that visit in 06/2013. Otherwise she had been going to just an urgent care for any other needed medical care. She did not think she has had her cholesterol or any other screening labs done in many years.  She had no known medical problems. She reported that she had h/o uterine fibroids. This was the reason that she had a complete hysterectomy. No other surgeries other than complete hysterectomy and tubal ligation.  At that visit she was found to have hypertension and was started on medication. She had a followup office visit 06/24/13 to recheck blood pressure and lab work on  Medication.  01/20/14 she presented for 6 month visit for followup. She was taking the benazepril HCTZ 10/12.5 daily as directed. She had not had any blood pressure checks anywhere else since prior visit here.  01/20/14 she also reported that she has been having some nonproductive cough. At that visit she reported  that this was never productive of any phlegm. No congestion in her chest. However through the visit she was holding a tissue to her nose. She said that for the past 2 weeks she has been having episodes of her nose suddenly starting to run-- this is clear watery drainage. Says that at the same time that this drainage runs out of her nose is when she usually has the coughing spells. Had some very mild irritation to her eyes but no significant itchy watery eyes. At that visit I felt that her symptoms were secondary to allergic  rhinitis.  However, today she says that the symptoms have worsened. She says that she has a lot of pressure behind her right eye. Also a lot of congestion in her nasal area. Also still having a lot of cough and now is productive of green phlegm.  At her last office visit her blood pressure was elevated and suboptimal. Increased her benazepril HCT to  20/12.5 mg. She states that she did start the new medication as directed. Is having no adverse effects.   Review of Systems: Consitutional: No fever, chills, fatigue, night sweats, lymphadenopathy. No significant/unexplained weight changes. Eyes: No visual changes, eye redness, or discharge. ENT/Mouth: No ear pain, sore throat, nasal drainage, or sinus pain. Cardiovascular: No chest pressure,heaviness, tightness or squeezing, even with exertion. No increased shortness of breath or dyspnea on exertion.No palpitations, edema, orthopnea, PND. Respiratory: No cough, hemoptysis, SOB, or wheezing. Gastrointestinal: No anorexia, dysphagia, reflux, pain, nausea, vomiting, hematemesis, diarrhea, constipation, BRBPR, or melena. Breast: No mass, nodules, bulging, or retraction. No skin changes or inflammation. No nipple discharge. No lymphadenopathy. Genitourinary: No dysuria, hematuria, incontinence, vaginal discharge, pruritis, burning, abnormal bleeding, or pain. Musculoskeletal: No decreased ROM, No joint pain or swelling. No significant pain in neck, back, or extremities. Skin: No rash, pruritis, or concerning lesions. Neurological: No headache, dizziness, syncope, seizures, tremors, memory loss, coordination problems, or paresthesias. Psychological: No anxiety, depression, hallucinations, SI/HI. Endocrine: No polydipsia, polyphagia, polyuria, or known  diabetes.No increased fatigue. No palpitations/rapid heart rate. No significant/unexplained weight change. All other systems were reviewed and are otherwise negative.  Past Medical History  Diagnosis  Date  . Hypertension      Past Surgical History  Procedure Laterality Date  . Tubal ligation    . Total abdominal hysterectomy      Secondary to fibroids-No cancer    Home Meds:  Outpatient Prescriptions Prior to Visit  Medication Sig Dispense Refill  . cetirizine (ZYRTEC) 10 MG tablet Take 1 tablet (10 mg total) by mouth daily.  30 tablet  11  . Multiple Vitamins-Minerals (MULTIVITAMIN PO) Take by mouth.      . benazepril-hydrochlorthiazide (LOTENSIN HCT) 20-12.5 MG per tablet Take 1 tablet by mouth daily.  30 tablet  0   No facility-administered medications prior to visit.       Allergies: No Known Allergies  History   Social History  . Marital Status: Single    Spouse Name: N/A    Number of Children: N/A  . Years of Education: N/A   Occupational History  . Not on file.   Social History Main Topics  . Smoking status: Never Smoker   . Smokeless tobacco: Never Used  . Alcohol Use: No  . Drug Use: No  . Sexual Activity: No   Other Topics Concern  . 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   3 children: Ages 37,30, 8 --all out of house    Family History  Problem Relation Age of Onset  . Cancer Father     ? Type  . Colon cancer Neg Hx     Physical Exam: Blood pressure 128/82, pulse 68, temperature 98.4 F (36.9 C), temperature source Oral, resp. rate 18, weight 162 lb (73.483 kg)., Body mass index is 25.37 kg/(m^2). General: Well developed, well nourished, African American female. in no acute distress. HEENT: Normocephalic, atraumatic. Conjunctiva pink, sclera non-icteric. Pupils 2 mm constricting to 1 mm, round, regular, and equally reactive to light and accomodation. EOMI. Internal auditory canal clear. TMs with good cone of light and without pathology. Nasal mucosa pink. Nares are without discharge. No sinus tenderness. Oral mucosa pink.  Pharynx  without exudate.   Neck: Supple. Trachea midline. No thyromegaly. Full ROM. No lymphadenopathy.No Carotid Bruits. Lungs: Clear to auscultation bilaterally without wheezes, rales, or rhonchi. Breathing is of normal effort and unlabored. Cardiovascular: RRR with S1 S2. No murmurs, rubs, or gallops. Musculoskeletal: Full range of motion and 5/5 strength throughout. Skin: Warm and moist.No rash. Neuro: A+Ox3. CN II-XII grossly intact. Moves all extremities spontaneously. Full sensation throughout. Normal gait. Psych:  Responds to questions appropriately with a normal affect.   Assessment/Plan:  53 y.o. y/o female here for  1. Hypertension Blood Pressure is now at goal. Continue current medication. Check lab to monitor. - BASIC METABOLIC PANEL WITH GFR - benazepril-hydrochlorthiazide (LOTENSIN HCT) 20-12.5 MG per tablet; Take 1 tablet by mouth daily.  Dispense: 30 tablet; Refill: 5   3. PREVENTIVE CARE--- She had complete physical exam on 06/10/2013.  A. Screening Labs:  Returned for fasting labs on 06/24/13.   B. No pap smear indicated since she has had hysterectomy secondary to fibroids and no history of cancer.  C. screening mammogram: Patient says her last mammogram was around the time of her last visit with Dr. Glo Herring. I will schedule as below.--TODAY she reports  that she did have her mammogram. Was negative.  D. Sreening colonoscopy: She has never had this. I have discussed risk and benefits of this and she is agreeable to proceed. I will refer her to GI.TODAY--reports she did have a colonoscopy.  E. Immunizations:  She has had no tetanus in over 10 years. She is agreeable to have this now.--GIVEN AT CPE 06/10/2013  She is agreeable to get influenza vaccine today. Give now.--GIVEN AT CPE 06/10/2013   Screening for colorectal cancer - Ambulatory referral to Gastroenterology   Breast cancer screening - MM Digital Screening; Future  Followup office visit in 6 months, sooner if  needed.  [address] [place], [place], [facility] 02/02/2014 4:29 PM

## 2014-02-03 LAB — BASIC METABOLIC PANEL WITH GFR
BUN: 16 mg/dL (ref 6–23)
CHLORIDE: 100 meq/L (ref 96–112)
CO2: 27 mEq/L (ref 19–32)
Calcium: 10 mg/dL (ref 8.4–10.5)
Creat: 1.02 mg/dL (ref 0.50–1.10)
GFR, Est African American: 73 mL/min
GFR, Est Non African American: 63 mL/min
GLUCOSE: 116 mg/dL — AB (ref 70–99)
POTASSIUM: 3.8 meq/L (ref 3.5–5.3)
Sodium: 136 mEq/L (ref 135–145)

## 2014-02-04 ENCOUNTER — Encounter: Payer: Self-pay | Admitting: Family Medicine

## 2014-02-10 ENCOUNTER — Encounter: Payer: Self-pay | Admitting: *Deleted

## 2014-02-17 ENCOUNTER — Encounter: Payer: Self-pay | Admitting: Physician Assistant

## 2014-02-17 ENCOUNTER — Ambulatory Visit (INDEPENDENT_AMBULATORY_CARE_PROVIDER_SITE_OTHER): Payer: BC Managed Care – PPO | Admitting: Physician Assistant

## 2014-02-17 VITALS — BP 138/92 | HR 68 | Temp 98.1°F | Resp 18 | Wt 166.0 lb

## 2014-02-17 DIAGNOSIS — I10 Essential (primary) hypertension: Secondary | ICD-10-CM

## 2014-02-17 DIAGNOSIS — R05 Cough: Secondary | ICD-10-CM

## 2014-02-17 DIAGNOSIS — R059 Cough, unspecified: Secondary | ICD-10-CM

## 2014-02-17 MED ORDER — AMLODIPINE BESYLATE 5 MG PO TABS: 5.0000 mg | ORAL_TABLET | Freq: Every day | ORAL | Status: DC

## 2014-02-17 MED ORDER — HYDROCHLOROTHIAZIDE 12.5 MG PO CAPS: 12.5000 mg | ORAL_CAPSULE | Freq: Every day | ORAL | Status: DC

## 2014-02-18 NOTE — Progress Notes (Signed)
Patient ID: Amber Boone MRN: [id number], DOB: 1960-12-23, 53 y.o. Date of Encounter: @DATE @  Chief Complaint:  Chief Complaint  Patient presents with  . having coughing fits    x sev months, recurrent    HPI: 53 y.o. year old female  presents with above.   I reviewed recent office notes from 01/20/14 and 02/02/14.  At this visit 01/20/14 she reported having nonproductive cough. She reported that day that for the past 2 weeks she had  been having episodes of her nose suddenly starting to run--watery clear drainage. At that same time that the drainage was running out of her nose is when she would also have coughing spells. At that time I treated her for allergic rhinitis and started Zyrtec.   At Office visit 02/02/14 she reported pressure behind the right eye. Also congestion in the nasal area. Also was having cough now productive of green phlegm. Prescribed azithromycin.  Today she reports that all other symptoms have resolved except for repetitive cough. Says that she never gets up any phlegm and does not feel congestion in her chest.   Past Medical History  Diagnosis Date  . Hypertension      Home Meds: Outpatient Prescriptions Prior to Visit  Medication Sig Dispense Refill  . cetirizine (ZYRTEC) 10 MG tablet Take 1 tablet (10 mg total) by mouth daily.  30 tablet  11  . Multiple Vitamins-Minerals (MULTIVITAMIN PO) Take by mouth.      . benazepril-hydrochlorthiazide (LOTENSIN HCT) 20-12.5 MG per tablet Take 1 tablet by mouth daily.  30 tablet  5  . azithromycin (ZITHROMAX) 250 MG tablet Day 1: Take 2 daily. Days 2-5: Take 1 daily.  6 tablet  0   No facility-administered medications prior to visit.    Allergies:  Allergies  Allergen Reactions  . Benazepril     Cough     History   Social History  . Marital Status: Single    Spouse Name: N/A    Number of Children: N/A  . Years of Education: N/A   Occupational History  . Not on file.   Social History Main Topics   . Smoking status: Never Smoker   . Smokeless tobacco: Never Used  . Alcohol Use: No  . Drug Use: No  . Sexual Activity: No   Other Topics Concern  . 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   3 children: Ages 23,30, 58 --all out of house    Family History  Problem Relation Age of Onset  . Cancer Father     ? Type  . Colon cancer Neg Hx      Review of Systems:  See HPI for pertinent ROS. All other ROS negative.    Physical Exam: Blood pressure 138/92, pulse 68, temperature 98.1 F (36.7 C), temperature source Oral, resp. rate 18, weight 166 lb (75.297 kg)., Body mass index is 25.99 kg/(m^2). General: WNWD AAF. Appears in no acute distress. Head: Normocephalic, atraumatic, eyes without discharge, sclera non-icteric, nares are without discharge. Bilateral auditory canals clear, TM's are without perforation, pearly grey and translucent with reflective cone of light bilaterally. Oral cavity moist, posterior pharynx without exudate, erythema, peritonsillar abscess, or post nasal drip.  Neck: Supple. No thyromegaly. No lymphadenopathy. Lungs: Clear bilaterally to auscultation without wheezes, rales, or rhonchi. Breathing is unlabored. Heart: RRR with S1 S2. No murmurs, rubs,  or gallops. Musculoskeletal:  Strength and tone normal for age. Extremities/Skin: Warm and dry. Neuro: Alert and oriented X 3. Moves all extremities spontaneously. Gait is normal. CNII-XII grossly in tact. Psych:  Responds to questions appropriately with a normal affect.     ASSESSMENT AND PLAN:  53 y.o. year old female with  1. Hypertension - hydrochlorothiazide (MICROZIDE) 12.5 MG capsule; Take 1 capsule (12.5 mg total) by mouth daily.  Dispense: 30 capsule; Refill: 5 - amLODipine (NORVASC) 5 MG tablet; Take 1 tablet (5 mg total) by mouth daily.  Dispense: 30 tablet; Refill: 5 2. Cough  Her cough  is probably secondary to ACE inhibitor. Currently is on benazepril HCTZ 20/12.5 mg. Will Continue HCTZ 12.5 mg. However will stop the benazepril and change this to amlodipine 5 mg. Return to office in 2 weeks to recheck blood pressure on this new medication.    Signed, [address] [place], [place], [facility] 02/18/2014 7:10 AM

## 2014-03-03 ENCOUNTER — Ambulatory Visit (INDEPENDENT_AMBULATORY_CARE_PROVIDER_SITE_OTHER): Payer: BC Managed Care – PPO | Admitting: Physician Assistant

## 2014-03-03 ENCOUNTER — Encounter: Payer: Self-pay | Admitting: Physician Assistant

## 2014-03-03 VITALS — BP 116/76 | HR 92 | Temp 98.5°F | Resp 18 | Wt 162.0 lb

## 2014-03-03 DIAGNOSIS — J3089 Other allergic rhinitis: Secondary | ICD-10-CM

## 2014-03-03 DIAGNOSIS — R059 Cough, unspecified: Secondary | ICD-10-CM

## 2014-03-03 DIAGNOSIS — R05 Cough: Secondary | ICD-10-CM

## 2014-03-03 DIAGNOSIS — I10 Essential (primary) hypertension: Secondary | ICD-10-CM

## 2014-03-03 DIAGNOSIS — J302 Other seasonal allergic rhinitis: Secondary | ICD-10-CM

## 2014-03-03 MED ORDER — OMEPRAZOLE 20 MG PO CPDR: 20.0000 mg | DELAYED_RELEASE_CAPSULE | Freq: Every day | ORAL | Status: DC

## 2014-03-03 NOTE — Progress Notes (Signed)
Patient ID: Keleigh Kazee MRN: [id number], DOB: 1961/04/08, 53 y.o. Date of Encounter: @DATE @  Chief Complaint:  Chief Complaint  Patient presents with  . BP check    still having coughing fits    HPI: 53 y.o. year old female  presents with above.   I reviewed recent office notes from 01/20/14 and 02/02/14 and 02/17/2014.  At office visit 01/20/14 she reported having nonproductive cough. She reported that day that for the past 2 weeks she had  been having episodes of her nose suddenly starting to run--watery clear drainage. At that same time that the drainage was running out of her nose is when she would also have coughing spells. At that time I treated her for allergic rhinitis and started Zyrtec.   At Office visit 02/02/14 she reported pressure behind the right eye. Also congestion in the nasal area. Also was having cough now productive of green phlegm. Prescribed azithromycin.  At [place] 02/17/14  she reported that all other symptoms had resolved except for repetitive cough. Said that she never gets up any phlegm and did not feel congestion in her chest. At that visit, it was felt that her cough was probably secondary to ACE inhibitor. At that visit was on benazepril HCTZ 20/12.5 mg. At that time stopped that med--stopped beneazepril.  Continued HCTZ 12.5 mg.Added amlopdipine 5mg  to take in place of benazepril.   Today patient states that she did make the medicine changes as planned. However she still has the cough. Says that she is also still taking the Zyrtec. Says that she has episodes of repetitive coughing that she cannot stop.  States that she has no symptoms of heartburn and does not feel or taste acid coming up into her throat. Even when she lies in bed at night she does not feel any of these symptoms. She is not currently taking any medications for acid reflux.  She does not smoke.    Past Medical History  Diagnosis Date  . Hypertension      Home Meds: Outpatient  Prescriptions Prior to Visit  Medication Sig Dispense Refill  . amLODipine (NORVASC) 5 MG tablet Take 1 tablet (5 mg total) by mouth daily.  30 tablet  5  . cetirizine (ZYRTEC) 10 MG tablet Take 1 tablet (10 mg total) by mouth daily.  30 tablet  11  . hydrochlorothiazide (MICROZIDE) 12.5 MG capsule Take 1 capsule (12.5 mg total) by mouth daily.  30 capsule  5  . Multiple Vitamins-Minerals (MULTIVITAMIN PO) Take by mouth.       No facility-administered medications prior to visit.    Allergies:  Allergies  Allergen Reactions  . Benazepril     Cough     History   Social History  . Marital Status: Single    Spouse Name: N/A    Number of Children: N/A  . Years of Education: N/A   Occupational History  . Not on file.   Social History Main Topics  . Smoking status: Never Smoker   . Smokeless tobacco: Never Used  . Alcohol Use: No  . Drug Use: No  . Sexual Activity: No   Other Topics Concern  . 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   3 children: Ages 70,30, 1 --all out of house    Family History  Problem Relation Age of Onset  . Cancer Father     ?  Type  . Colon cancer Neg Hx      Review of Systems:  See HPI for pertinent ROS. All other ROS negative.    Physical Exam: Blood pressure 116/76, pulse 92, temperature 98.5 F (36.9 C), temperature source Oral, resp. rate 18, weight 162 lb (73.483 kg)., Body mass index is 25.37 kg/(m^2). General: WNWD AAF. Appears in no acute distress. Head: Normocephalic, atraumatic, eyes without discharge, sclera non-icteric, nares are without discharge. Bilateral auditory canals clear, TM's are without perforation, pearly grey and translucent with reflective cone of light bilaterally. Oral cavity moist, posterior pharynx without exudate, erythema, peritonsillar abscess, or post nasal drip.  Neck: Supple. No thyromegaly. No  lymphadenopathy. Lungs: Clear bilaterally to auscultation without wheezes, rales, or rhonchi. Breathing is unlabored. Heart: RRR with S1 S2. No murmurs, rubs, or gallops. Musculoskeletal:  Strength and tone normal for age. Extremities/Skin: Warm and dry. Neuro: Alert and oriented X 3. Moves all extremities spontaneously. Gait is normal. CNII-XII grossly in tact. Psych:  Responds to questions appropriately with a normal affect.     ASSESSMENT AND PLAN:  53 y.o. year old female with  1. Hypertension Blood Pressure is well controlled on current medications. Continue HCTZ and Norvasc. - hydrochlorothiazide (MICROZIDE) 12.5 MG capsule; Take 1 capsule (12.5 mg total) by mouth daily.  Dispense: 30 capsule; Refill: 5 - amLODipine (NORVASC) 5 MG tablet; Take 1 tablet (5 mg total) by mouth daily.  Dispense: 30 tablet; Refill: 5  2. Cough Take off of ACE inhibitor. Continue Vasotec to continue to cover her for allergic rhinitis as cause Add omeprazole in case symptoms are secondary to GERD Will obtain chest x-ray.  I will f/u with her once I get the results of the chest x-ray. If the chest x-ray shows no abnormality, then she will take the omeprazole daily. In 2 weeks she will call me and let me know whether the cough has resolved or not. At that time, if the cough has not resolved then we will continue to work this up. Rather than make her come in for another OV in 2 weeks, will f/u with phone call in 2 weeks. Pt voices understanding and agrees to f/u as outlined above.    [address] [place], [place], [facility] 03/03/2014 4:40 PM

## 2014-03-04 ENCOUNTER — Ambulatory Visit
Admission: RE | Admit: 2014-03-04 | Discharge: 2014-03-04 | Disposition: A | Discharge status: Home / Self Care | Payer: BC Managed Care – PPO | Source: Ambulatory Visit | Attending: Physician Assistant | Admitting: Physician Assistant

## 2014-03-04 DIAGNOSIS — R05 Cough: Secondary | ICD-10-CM

## 2014-03-04 DIAGNOSIS — R059 Cough, unspecified: Secondary | ICD-10-CM

## 2014-04-04 ENCOUNTER — Telehealth: Payer: Self-pay | Admitting: Physician Assistant

## 2014-04-04 NOTE — Telephone Encounter (Signed)
Patient is calling to speak with you about does she need an office visit? She was not very specific with me? [phone]

## 2014-04-07 NOTE — Telephone Encounter (Signed)
Call placed to patient. LMTRC.  

## 2014-04-14 ENCOUNTER — Telehealth: Payer: Self-pay | Admitting: *Deleted

## 2014-04-14 NOTE — Telephone Encounter (Signed)
Pt came into office stating she is feeling much better since her xray and states she has not been coughing now for 2 weeks and wants to know if you want her back on lisinopril, I looked at last ov notes and it stated that she is to be off ACE inhibitors, she is ok with that but wants to be sure.

## 2014-04-18 NOTE — Telephone Encounter (Signed)
YES, stay off of the lisinopril. I had already adjusted her blood pressure medications so that her new/current blood pressure medicines are controlling her blood pressure. At her LOV her BP was excellent on current meds.

## 2014-04-19 NOTE — Telephone Encounter (Signed)
LMTRC

## 2014-04-21 NOTE — Telephone Encounter (Signed)
LM via VM to stay off lisinopril per provider

## 2014-05-05 ENCOUNTER — Ambulatory Visit: Payer: BC Managed Care – PPO | Admitting: Physician Assistant

## 2014-05-11 ENCOUNTER — Ambulatory Visit (INDEPENDENT_AMBULATORY_CARE_PROVIDER_SITE_OTHER): Payer: BC Managed Care – PPO | Admitting: Physician Assistant

## 2014-05-11 ENCOUNTER — Encounter: Payer: Self-pay | Admitting: Physician Assistant

## 2014-05-11 VITALS — BP 142/88 | HR 78 | Temp 97.9°F | Resp 16 | Ht 65.5 in | Wt 166.0 lb

## 2014-05-11 DIAGNOSIS — I10 Essential (primary) hypertension: Secondary | ICD-10-CM

## 2014-05-11 NOTE — Progress Notes (Signed)
    Patient ID: Amber Boone MRN: [id number], DOB: 1960/11/01, 53 y.o. Date of Encounter: 05/11/2014, 1:46 PM    Chief Complaint:  Chief Complaint  Patient presents with  . Med check  . Cough - ?BP med     HPI: 53 y.o. year old AA female  here to followup regarding the above.  Recently she had several office visits to evaluate because she was having cough. In the process, her ACE inhibitor was stopped and her other blood pressure medications were adjusted to keep her blood pressure controlled. She was confused about whether she was supposed to continue current medications or whether any of her medicines needed to be adjusted.  She states that her cough has resolved and she has had absolutely no cough for weeks/months now.  She has no complaints today.     Home Meds:   Outpatient Prescriptions Prior to Visit  Medication Sig Dispense Refill  . amLODipine (NORVASC) 5 MG tablet Take 1 tablet (5 mg total) by mouth daily.  30 tablet  5  . cetirizine (ZYRTEC) 10 MG tablet Take 1 tablet (10 mg total) by mouth daily.  30 tablet  11  . hydrochlorothiazide (MICROZIDE) 12.5 MG capsule Take 1 capsule (12.5 mg total) by mouth daily.  30 capsule  5  . Multiple Vitamins-Minerals (MULTIVITAMIN PO) Take by mouth.      Marland Kitchen omeprazole (PRILOSEC) 20 MG capsule Take 1 capsule (20 mg total) by mouth daily.  30 capsule  3   No facility-administered medications prior to visit.    Allergies:  Allergies  Allergen Reactions  . Benazepril     Cough       Review of Systems: See HPI for pertinent ROS. All other ROS negative.    Physical Exam: Blood pressure 142/88, pulse 78, temperature 97.9 F (36.6 C), temperature source Oral, resp. rate 16, height 5' 5.5" (1.664 m), weight 166 lb (75.297 kg)., Body mass index is 27.19 kg/(m^2). General: WNWD AAF.  Appears in no acute distress.  Neck: Supple. No thyromegaly. No lymphadenopathy. No carotid bruits. Lungs: Clear bilaterally to auscultation  without wheezes, rales, or rhonchi. Breathing is unlabored. Heart: Regular rhythm. No murmurs, rubs, or gallops. Msk:  Strength and tone normal for age. Extremities/Skin: Warm and dry. No edema. Neuro: Alert and oriented X 3. Moves all extremities spontaneously. Gait is normal. CNII-XII grossly in tact. Psych:  Responds to questions appropriately with a normal affect.     ASSESSMENT AND PLAN:  53 y.o. year old female with  1. Essential hypertension Blood pressure is controlled. Recent lab work on 02/02/14 had normal BMET. Continue current medication the same. Can wait 6 months for routine followup office visit or follow up sooner if needed.   [address] [place], [place], [facility] 05/11/2014 1:46 PM

## 2014-06-13 ENCOUNTER — Encounter: Payer: Self-pay | Admitting: Physician Assistant

## 2014-06-13 ENCOUNTER — Ambulatory Visit (INDEPENDENT_AMBULATORY_CARE_PROVIDER_SITE_OTHER): Payer: BC Managed Care – PPO | Admitting: Physician Assistant

## 2014-06-13 VITALS — BP 114/72 | HR 76 | Temp 98.3°F | Resp 18 | Ht 65.5 in | Wt 166.0 lb

## 2014-06-13 DIAGNOSIS — Z0000 Encounter for general adult medical examination without abnormal findings: Secondary | ICD-10-CM

## 2014-06-13 DIAGNOSIS — Z23 Encounter for immunization: Secondary | ICD-10-CM

## 2014-06-13 DIAGNOSIS — I10 Essential (primary) hypertension: Secondary | ICD-10-CM

## 2014-06-13 DIAGNOSIS — J301 Allergic rhinitis due to pollen: Secondary | ICD-10-CM

## 2014-06-13 MED ORDER — HYDROCHLOROTHIAZIDE 12.5 MG PO CAPS: 12.5000 mg | ORAL_CAPSULE | Freq: Every day | ORAL | Status: DC

## 2014-06-13 MED ORDER — AMLODIPINE BESYLATE 5 MG PO TABS: 5.0000 mg | ORAL_TABLET | Freq: Every day | ORAL | Status: DC

## 2014-06-13 NOTE — Progress Notes (Signed)
Patient ID: Amber Boone MRN: [id number], DOB: [birth year], 53 y.o. Date of Encounter: 06/13/2014,   Chief Complaint: Physical (CPE)  HPI: 53 y.o. y/o AA female  here for CPE.   Her last complete physical exam was with me on 06/10/2013. At that visit, she was a new patient to our office and was here to establish care.  She said that she saw Dr. Glo Herring for her GYN care in the past. She thought that her last visit with him was about 2 years prior to that [place] with me in 2014. Otherwise she had been going to just an urgent care for any other needed medical care. She did not think she has had her cholesterol or any other screening labs done in many years.  She had no known medical problems. She reported that she had h/o uterine fibroids. This was the reason that she had a complete hysterectomy. No other surgeries other than complete hysterectomy and tubal ligation.  Since that visit, she has been diagnosed with hypertension by me and is being treated for hypertension.  At her complete physical exam 06/2013 she did have complete screening labs. These were all normal. See assessment/plan at the end of the note regarding other preventive care.  She states that she is no longer needing to take the omeprazole. It says that she has no heartburn symptoms or acid reflux.   Review of Systems: Consitutional: No fever, chills, fatigue, night sweats, lymphadenopathy. No significant/unexplained weight changes. Eyes: No visual changes, eye redness, or discharge. ENT/Mouth: No ear pain, sore throat, nasal drainage, or sinus pain. Cardiovascular: No chest pressure,heaviness, tightness or squeezing, even with exertion. No increased shortness of breath or dyspnea on exertion.No palpitations, edema, orthopnea, PND. Respiratory: No cough, hemoptysis, SOB, or wheezing. Gastrointestinal: No anorexia, dysphagia, reflux, pain, nausea, vomiting, hematemesis, diarrhea, constipation, BRBPR, or melena. Breast: No  mass, nodules, bulging, or retraction. No skin changes or inflammation. No nipple discharge. No lymphadenopathy. Genitourinary: No dysuria, hematuria, incontinence, vaginal discharge, pruritis, burning, abnormal bleeding, or pain. Musculoskeletal: No decreased ROM, No joint pain or swelling. No significant pain in neck, back, or extremities. Skin: No rash, pruritis, or concerning lesions. Neurological: No headache, dizziness, syncope, seizures, tremors, memory loss, coordination problems, or paresthesias. Psychological: No anxiety, depression, hallucinations, SI/HI. Endocrine: No polydipsia, polyphagia, polyuria, or known diabetes.No increased fatigue. No palpitations/rapid heart rate. No significant/unexplained weight change. All other systems were reviewed and are otherwise negative.  Past Medical History  Diagnosis Date  . Hypertension      Past Surgical History  Procedure Laterality Date  . Tubal ligation    . Total abdominal hysterectomy      Secondary to fibroids-No cancer    Home Meds:  Outpatient Prescriptions Prior to Visit  Medication Sig Dispense Refill  . cetirizine (ZYRTEC) 10 MG tablet Take 1 tablet (10 mg total) by mouth daily.  30 tablet  11  . Multiple Vitamins-Minerals (MULTIVITAMIN PO) Take by mouth.      Marland Kitchen amLODipine (NORVASC) 5 MG tablet Take 1 tablet (5 mg total) by mouth daily.  30 tablet  5  . omeprazole (PRILOSEC) 20 MG capsule Take 1 capsule (20 mg total) by mouth daily.  30 capsule  3  . hydrochlorothiazide (MICROZIDE) 12.5 MG capsule Take 1 capsule (12.5 mg total) by mouth daily.  30 capsule  5   No facility-administered medications prior to visit.      Allergies:  Allergies  Allergen Reactions  . Benazepril     Cough  History   Social History  . Marital Status: Single    Spouse Name: N/A    Number of Children: N/A  . Years of Education: N/A   Occupational History  . Not on file.   Social History Main Topics  . Smoking status:  Never Smoker   . Smokeless tobacco: Never Used  . Alcohol Use: No  . Drug Use: No  . Sexual Activity: No   Other Topics Concern  . 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   3 children: Ages 74,30, 90 --all out of house    Family History  Problem Relation Age of Onset  . Cancer Father     ? Type  . Colon cancer Neg Hx     Physical Exam: Blood pressure 114/72, pulse 76, temperature 98.3 F (36.8 C), temperature source Oral, resp. rate 18, height 5' 5.5" (1.664 m), weight 166 lb (75.297 kg)., Body mass index is 27.19 kg/(m^2). General: Well developed, well nourished, African American female. in no acute distress. HEENT: Normocephalic, atraumatic. Conjunctiva pink, sclera non-icteric. Pupils 2 mm constricting to 1 mm, round, regular, and equally reactive to light and accomodation. EOMI. Internal auditory canal clear. TMs with good cone of light and without pathology. Nasal mucosa pink. Nares are without discharge. No sinus tenderness. Oral mucosa pink.  Pharynx without exudate.   Neck: Supple. Trachea midline. No thyromegaly. Full ROM. No lymphadenopathy.No Carotid Bruits. Lungs: Clear to auscultation bilaterally without wheezes, rales, or rhonchi. Breathing is of normal effort and unlabored. Cardiovascular: RRR with S1 S2. No murmurs, rubs, or gallops. Distal pulses 2+ symmetrically. No carotid or abdominal bruits. Breast: Symmetrical. No masses. Nipples without discharge. Abdomen: Soft, non-tender, non-distended with normoactive bowel sounds. No hepatosplenomegaly or masses. No rebound/guarding. No CVA tenderness. No hernias.  Genitourinary:  External genitalia without lesions. Vaginal mucosa pink.No discharge present. Mucosa is normal. Bimanual exam is normal with no mass. Musculoskeletal: Full range of motion and 5/5 strength throughout. Without swelling, atrophy, tenderness,  crepitus, or warmth. Extremities without clubbing, cyanosis, or edema. Calves supple. Skin: Warm and moist without erythema, ecchymosis, wounds, or rash. Neuro: A+Ox3. CN II-XII grossly intact. Moves all extremities spontaneously. Full sensation throughout. Normal gait. DTR 2+ throughout upper and lower extremities. Finger to nose intact. Psych:  Responds to questions appropriately with a normal affect.   Assessment/Plan:  53 y.o. y/o female here for CPE 1. Visit for preventive health examination  A. Screening Labs:  06/2013 she did full panel of screening labs which were all normal. CBC--Normal CMET--Normal TSH--Normal FLP--Normal     LDL= 124 Vitamin D = 36 -normal.  B. No pap smear indicated since she has had hysterectomy secondary to fibroids and no history of cancer.  C. screening mammogram: Patient says she did follow up with mammogram after her physical with me and had it in October 2014. She is aware that she will need followup annually and will schedule followup.  D. Screening colonoscopy: At her OV 06/2013 she reported she had never had this. I referred her to GI. She had a screening colonoscopy performed by Dr. Arelia Longest 07/01/2013. He is with [facility] so this is documented in Health Maintenance by him.  E. Immunizations:  Tdap given here 06/2013  Influenza vaccine given here 06/2013. Patient agreeable to up date with influenza vaccine today. Pneumonia vaccine--she has no indication to need this prior  to age 20 Zostavax-will discuss at age 89   She will need routine followup office visit in 6 months to followup her hypertension. She just had a BMET 02/02/14. Only on HCTZ 12.5 mg-- can wait to recheck this. Also, will not recheck labs today because patient says that she actually has been out of her HCTZ recently and had issues regarding refills at the pharmacy but this has been addressed today. Follow up sooner if needed.  Signed, [address] [place], [place],  [facility] 06/13/2014 8:21 AM

## 2014-07-15 ENCOUNTER — Other Ambulatory Visit: Payer: Self-pay

## 2014-07-15 DIAGNOSIS — Z1231 Encounter for screening mammogram for malignant neoplasm of breast: Secondary | ICD-10-CM

## 2014-08-01 ENCOUNTER — Ambulatory Visit
Admission: RE | Admit: 2014-08-01 | Discharge: 2014-08-01 | Disposition: A | Discharge status: Home / Self Care | Payer: BC Managed Care – PPO | Source: Ambulatory Visit

## 2014-08-01 DIAGNOSIS — Z1231 Encounter for screening mammogram for malignant neoplasm of breast: Secondary | ICD-10-CM

## 2014-10-03 ENCOUNTER — Encounter: Payer: Self-pay | Admitting: Physician Assistant

## 2014-11-09 ENCOUNTER — Encounter: Payer: Self-pay | Admitting: Physician Assistant

## 2014-12-15 ENCOUNTER — Encounter: Payer: Self-pay | Admitting: Physician Assistant

## 2015-01-01 ENCOUNTER — Other Ambulatory Visit: Payer: Self-pay | Admitting: Physician Assistant

## 2015-01-02 NOTE — Telephone Encounter (Signed)
Pt has been discharged from practice.  Still within 30 day window.  Only one month refills sent.

## 2015-02-02 ENCOUNTER — Other Ambulatory Visit: Payer: Self-pay | Admitting: Physician Assistant

## 2015-02-02 NOTE — Telephone Encounter (Signed)
Patient has been discharged from practice.  Medication refill denied 

## 2015-02-10 ENCOUNTER — Other Ambulatory Visit: Payer: Self-pay | Admitting: Physician Assistant

## 2015-02-10 ENCOUNTER — Encounter (HOSPITAL_COMMUNITY): Payer: Self-pay

## 2015-02-10 ENCOUNTER — Emergency Department (HOSPITAL_COMMUNITY)
Admission: EM | Admit: 2015-02-10 | Discharge: 2015-02-10 | Disposition: A | Discharge status: Home / Self Care | Payer: BLUE CROSS/BLUE SHIELD | Source: Home / Self Care | Attending: Internal Medicine | Admitting: Internal Medicine

## 2015-02-10 DIAGNOSIS — S90862A Insect bite (nonvenomous), left foot, initial encounter: Secondary | ICD-10-CM | POA: Diagnosis not present

## 2015-02-10 DIAGNOSIS — W57XXXA Bitten or stung by nonvenomous insect and other nonvenomous arthropods, initial encounter: Secondary | ICD-10-CM | POA: Diagnosis not present

## 2015-02-10 MED ORDER — PREDNISONE 50 MG PO TABS: 50.0000 mg | ORAL_TABLET | Freq: Every day | ORAL | Status: AC

## 2015-02-10 NOTE — Discharge Instructions (Signed)
Prescription for prednisone (steroid), to decreased redness/swelling/itching, was sent to the pharmacy.  Insect Bite Mosquitoes, flies, fleas, bedbugs, and other insects can bite. Insect bites are different from insect stings. The bite may be red, puffy (swollen), and itchy for 2 to 4 days. Most bites get better on their own. HOME CARE   Do not scratch the bite.  Keep the bite clean and dry. Wash the bite with soap and water.  Put ice on the bite.  Put ice in a plastic bag.  Place a towel between your skin and the bag.  Leave the ice on for 20 minutes, 4 times a day. Do this for the first 2 to 3 days, or as told by your doctor.  You may use medicated lotions or creams to lessen itching as told by your doctor.  Only take medicines as told by your doctor.  If you are given medicines (antibiotics), take them as told. Finish them even if you start to feel better. You may need a tetanus shot if:  You cannot remember when you had your last tetanus shot.  You have never had a tetanus shot.  The injury broke your skin. If you need a tetanus shot and you choose not to have one, you may get tetanus. Sickness from tetanus can be serious. GET HELP RIGHT AWAY IF:   You have more pain, redness, or puffiness.  You see a red line on the skin coming from the bite.  You have a fever.  You have joint pain.  You have a headache or neck pain.  You feel weak.  You have a rash.  You have chest pain, or you are short of breath.  You have belly (abdominal) pain.  You feel sick to your stomach (nauseous) or throw up (vomit).  You feel very tired or sleepy. MAKE SURE YOU:   Understand these instructions.  Will watch your condition.  Will get help right away if you are not doing well or get worse. Document Released: 08/23/2000 Document Revised: 11/18/2011 Document Reviewed: 03/27/2011 [facility] Patient Information 2015 [place], [place]. This information is not intended to replace  advice given to you by your health care provider. Make sure you discuss any questions you have with your health care provider.

## 2015-02-10 NOTE — ED Notes (Signed)
States she noted problem on left ankle yesterday, thinks may be an insect bite of some kind. No known injury

## 2015-02-10 NOTE — ED Provider Notes (Signed)
CSN: [id number]     Arrival date & time 02/10/15  1019 History   First MD Initiated Contact with Patient 02/10/15 1210     Chief Complaint  Patient presents with  . Skin Problem   HPI  Patient had the onset of itching yesterday, at work, there appeared to be 2 small bites on the lateral left ankle.  Within several hours, the lateral ankle and had developed a large red/swollen area around the bite sites. Some discomfort after the swelling started, was not painful initially. No trauma, patient did not fall. No history of gout. No broken skin on the foot. No maceration between toes. No fever, no malaise. Not coughing, not wheezing. No GI distress   Past Medical History  Diagnosis Date  . Hypertension    Past Surgical History  Procedure Laterality Date  . Tubal ligation    . Total abdominal hysterectomy      Secondary to fibroids-No cancer   Family History  Problem Relation Age of Onset  . Cancer Father     ? Type  . Colon cancer Neg Hx    History  Substance Use Topics  . Smoking status: Never Smoker   . Smokeless tobacco: Never Used  . Alcohol Use: No   OB History    No data available     Review of Systems  All other systems reviewed and are negative.   Allergies  Benazepril  Home Medications   Prior to Admission medications   Medication Sig Start Date End Date Taking? Authorizing Provider  amLODipine (NORVASC) 5 MG tablet TAKE 1 TABLET BY MOUTH DAILY 01/02/15   Orlena Sheldon, PA-C  cetirizine (ZYRTEC) 10 MG tablet Take 1 tablet (10 mg total) by mouth daily. 01/20/14   Lonie Peak Dixon, PA-C  hydrochlorothiazide (MICROZIDE) 12.5 MG capsule TAKE ONE CAPSULE EVERY DAY 01/02/15   Orlena Sheldon, PA-C  Multiple Vitamins-Minerals (MULTIVITAMIN PO) Take by mouth.    Historical Provider, MD  omeprazole (PRILOSEC) 20 MG capsule Take 1 capsule (20 mg total) by mouth daily. 03/03/14   Mary B Dixon, PA-C    BP 134/84 mmHg  Pulse 69  Temp(Src) 98.4 F (36.9 C) (Oral)  Resp 16  SpO2  99% Physical Exam  Constitutional: She is oriented to person, place, and time. No distress.  Alert, nicely groomed  HENT:  Head: Atraumatic.  Eyes:  Conjugate gaze, no eye redness/drainage  Neck: Neck supple.  Cardiovascular: Normal rate.   Pulmonary/Chest: No respiratory distress.  Abdominal: She exhibits no distension.  Musculoskeletal: Normal range of motion.  No leg swelling  Neurological: She is alert and oriented to person, place, and time.  Skin: Skin is warm and dry.  No cyanosis Left lateral ankle with swelling, erythema. Within the swollen area, there are 2 small focal areas with puncta, consistent with insect bites. Ankle range of motion is normal, and apparently pain free.  Nursing note and vitals reviewed.   ED Course  Procedures (including critical care time) Labs Review Labs Reviewed - No data to display  Imaging Review No results found.   MDM   1. Insect bite of foot with local reaction, left, initial encounter    prescription for prednisone burst sent to pharmacy. Continue Zyrtec. Recheck or follow up PCP, Dena Billet, if not improving in a week.    Sherlene Shams, MD 02/10/15 1249

## 2015-02-23 ENCOUNTER — Other Ambulatory Visit: Payer: Self-pay | Admitting: Physician Assistant

## 2015-03-10 ENCOUNTER — Other Ambulatory Visit: Payer: Self-pay | Admitting: Physician Assistant

## 2015-03-10 NOTE — Telephone Encounter (Signed)
Medication refilled per protocol. 

## 2015-03-14 ENCOUNTER — Other Ambulatory Visit: Payer: Self-pay | Admitting: Physician Assistant

## 2015-03-15 NOTE — Telephone Encounter (Signed)
Medication refilled per protocol. 

## 2015-05-23 IMAGING — US US BREAST*R*
1 series · 9 of 9 positions shown · non-contrast
Comparison: 07/09/2013

CLINICAL DATA: Mass right breast identified on recent screening
mammogram/tomogram.

EXAM:
ULTRASOUND RIGHT BREAST

[Series 1: us breast*right* · 9 of 9 slices shown]
[im 1/9]
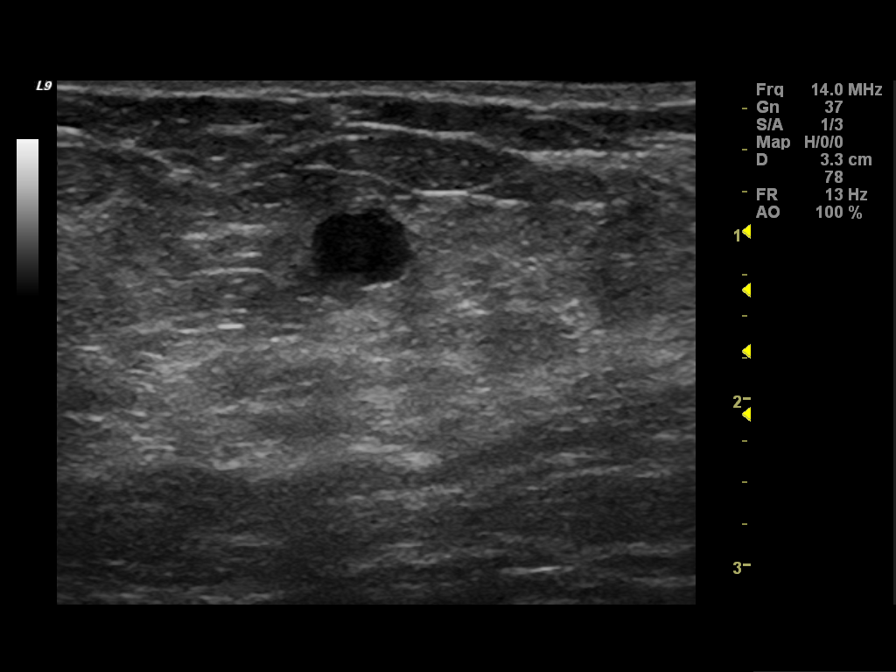
[im 2/9]
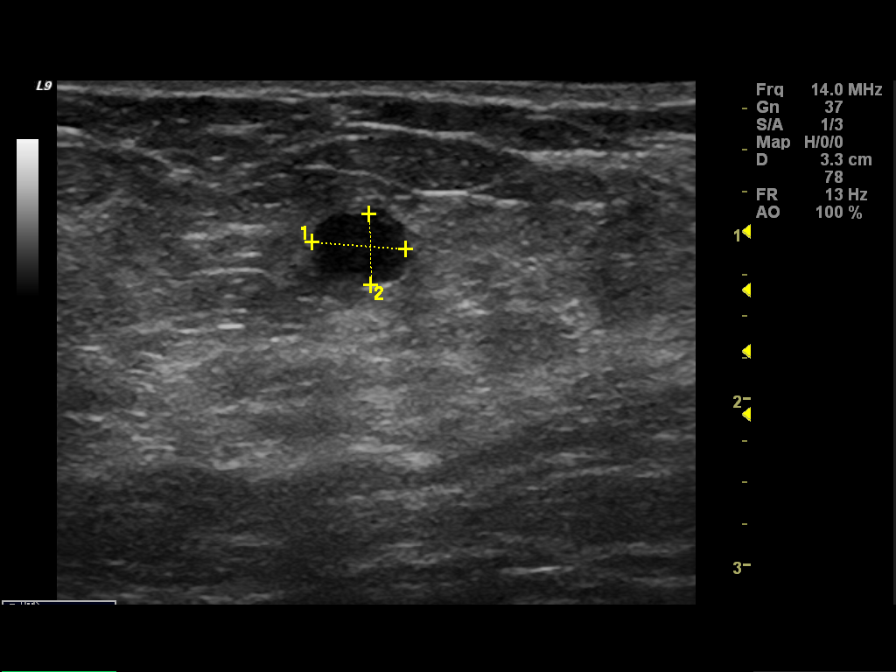
[im 3/9]
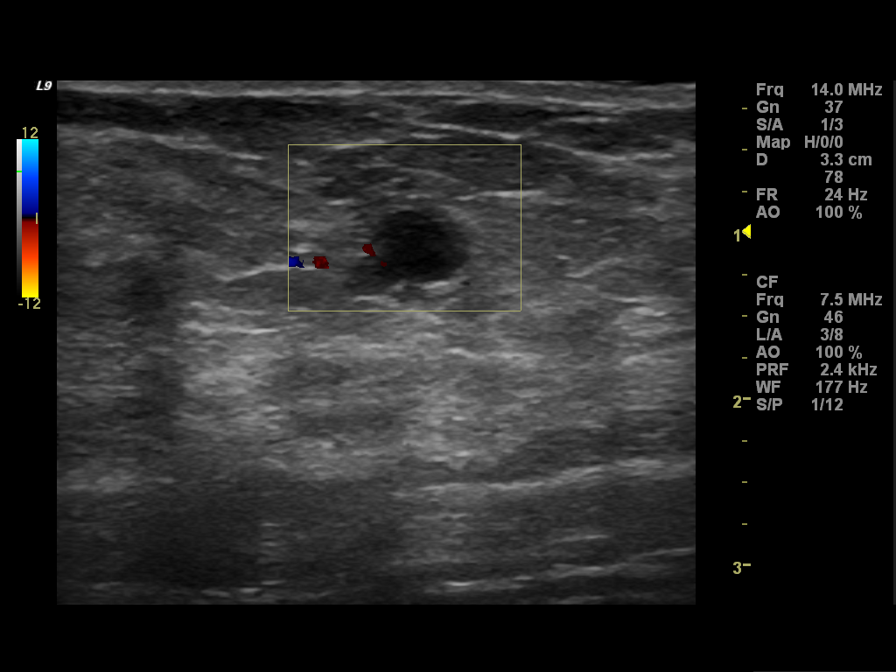
[im 4/9]
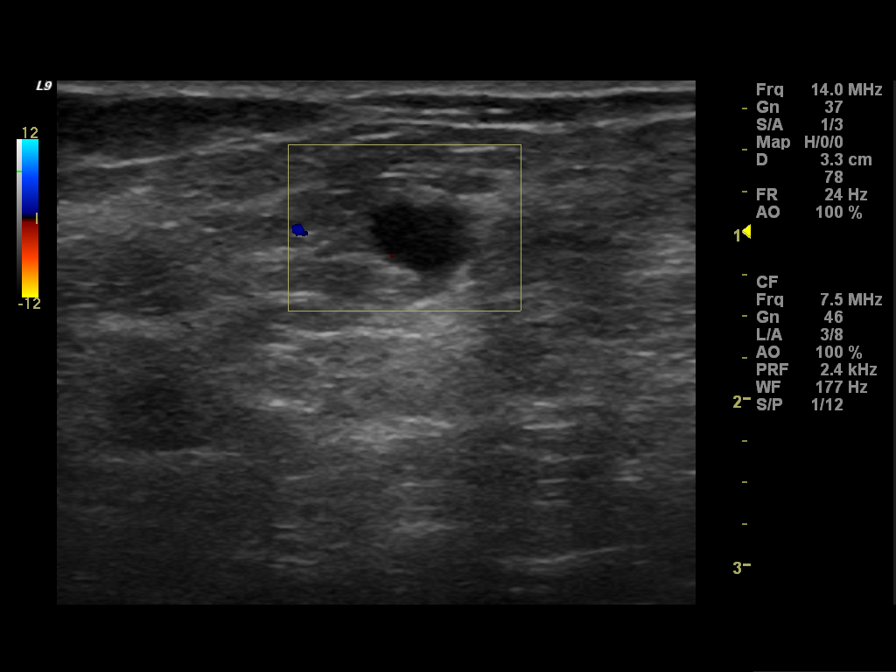
[im 5/9]
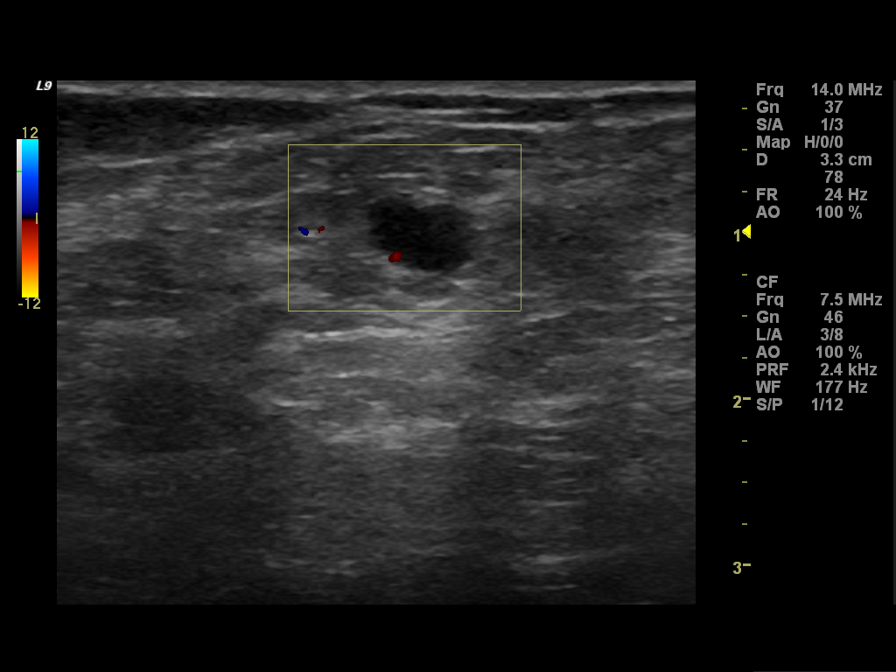
[im 6/9]
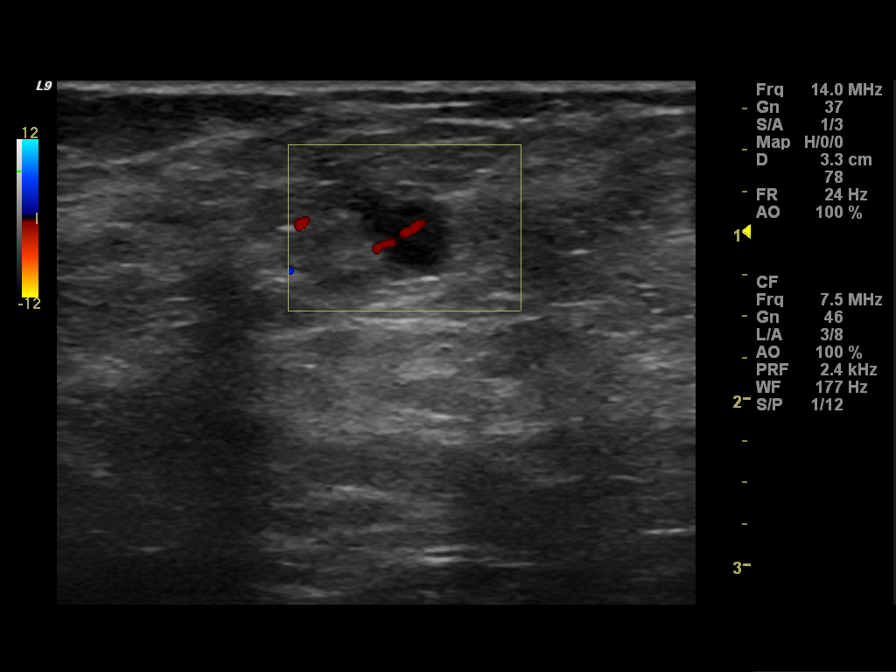
[im 7/9]
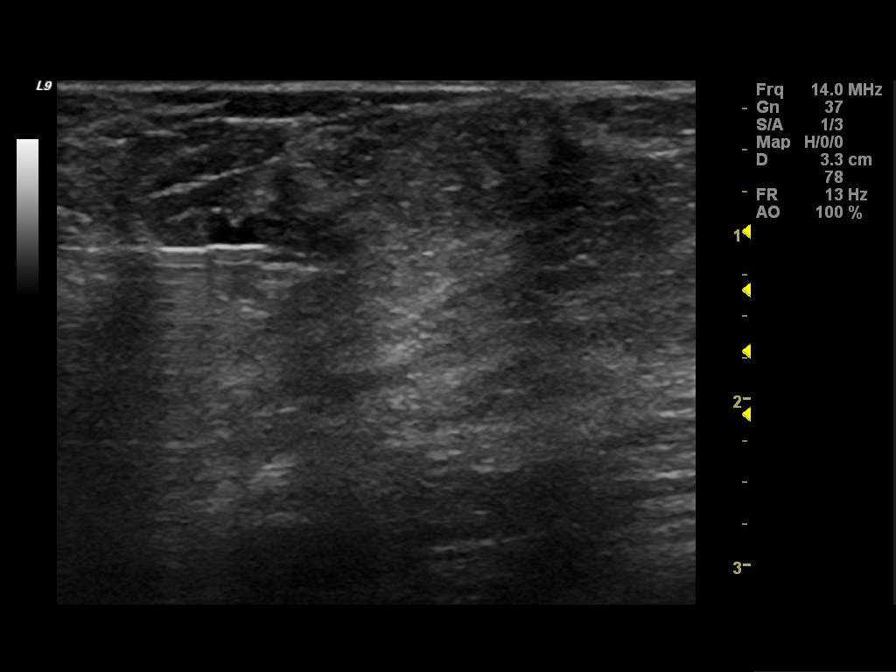
[im 8/9]
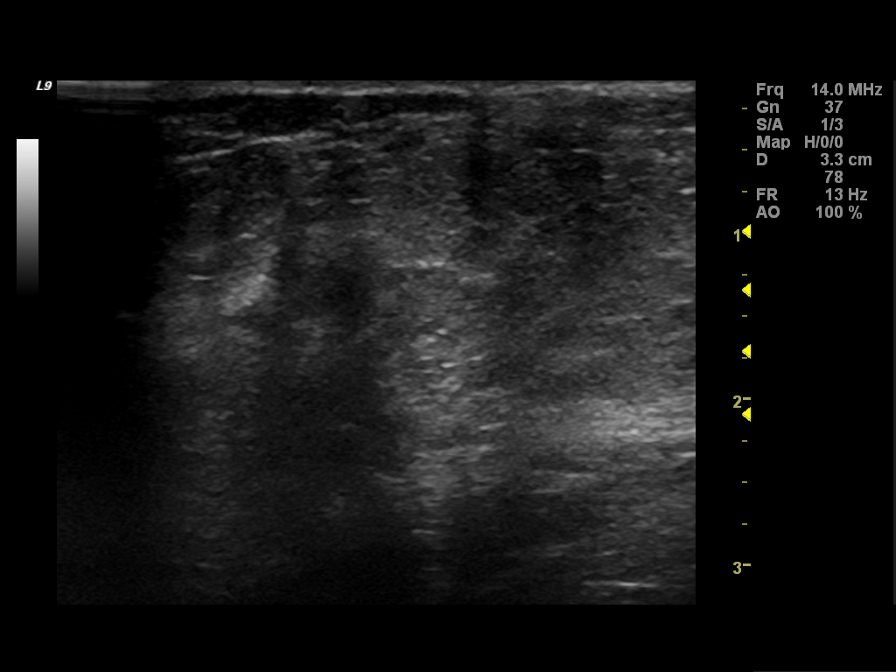
[im 9/9]
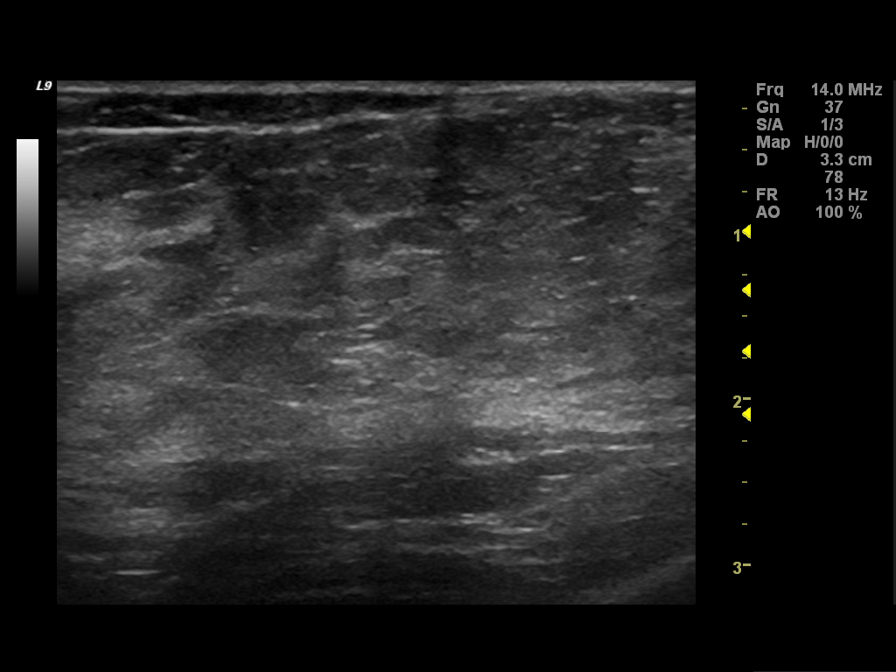

[9 of 9 positions shown; findings below may reference images not displayed]

FINDINGS: On physical exam,no mass is palpated in the outer right breast..

Ultrasound is performed, showing a 6 x 4 x 6 mm predominately
anechoic nodule with some internal echoes. Vascular flow is seen
along one of the margins of the nodule.
IMPRESSION: Probable new mildly complex cyst with an adjacent vascular flow.

RECOMMENDATION:
Attempted cyst aspiration is suggested. If the probable cyst does
not aspirate, a biopsy could be performed. This was discussed with
the patient. She can state today to have cyst aspiration performed.
This will be dictated separately

I have discussed the findings and recommendations with the patient.
Results were also provided in writing at the conclusion of the
visit. If applicable, a reminder letter will be sent to the patient
regarding the next appointment.

BI-RADS CATEGORY  0: Incomplete. Need additional imaging evaluation
and/or prior mammograms for comparison.

## 2015-06-24 ENCOUNTER — Other Ambulatory Visit: Payer: Self-pay | Admitting: Physician Assistant

## 2015-06-26 ENCOUNTER — Encounter: Payer: Self-pay | Admitting: Family Medicine

## 2015-06-26 NOTE — Telephone Encounter (Signed)
Medication refill for one time only.  Patient needs to be seen.  Letter sent for patient to call and schedule 

## 2015-07-12 ENCOUNTER — Other Ambulatory Visit: Payer: Self-pay

## 2015-07-12 ENCOUNTER — Ambulatory Visit (INDEPENDENT_AMBULATORY_CARE_PROVIDER_SITE_OTHER): Payer: BLUE CROSS/BLUE SHIELD | Admitting: Physician Assistant

## 2015-07-12 ENCOUNTER — Encounter: Payer: Self-pay | Admitting: Physician Assistant

## 2015-07-12 VITALS — BP 124/78 | HR 68 | Temp 98.6°F | Resp 18 | Ht 66.5 in | Wt 179.0 lb

## 2015-07-12 DIAGNOSIS — I10 Essential (primary) hypertension: Secondary | ICD-10-CM | POA: Diagnosis not present

## 2015-07-12 DIAGNOSIS — Z0000 Encounter for general adult medical examination without abnormal findings: Secondary | ICD-10-CM | POA: Diagnosis not present

## 2015-07-12 DIAGNOSIS — J301 Allergic rhinitis due to pollen: Secondary | ICD-10-CM

## 2015-07-12 DIAGNOSIS — Z23 Encounter for immunization: Secondary | ICD-10-CM | POA: Diagnosis not present

## 2015-07-12 DIAGNOSIS — Z1231 Encounter for screening mammogram for malignant neoplasm of breast: Secondary | ICD-10-CM

## 2015-07-12 LAB — CBC WITH DIFFERENTIAL/PLATELET
Basophils Absolute: 0 10*3/uL (ref 0.0–0.1)
Basophils Relative: 0 % (ref 0–1)
EOS ABS: 0.2 10*3/uL (ref 0.0–0.7)
EOS PCT: 3 % (ref 0–5)
HEMATOCRIT: 42.6 % (ref 36.0–46.0)
Hemoglobin: 14.4 g/dL (ref 12.0–15.0)
LYMPHS PCT: 42 % (ref 12–46)
Lymphs Abs: 2.5 10*3/uL (ref 0.7–4.0)
MCH: 27.5 pg (ref 26.0–34.0)
MCHC: 33.8 g/dL (ref 30.0–36.0)
MCV: 81.3 fL (ref 78.0–100.0)
MONO ABS: 0.5 10*3/uL (ref 0.1–1.0)
MONOS PCT: 8 % (ref 3–12)
MPV: 10.7 fL (ref 8.6–12.4)
Neutro Abs: 2.8 10*3/uL (ref 1.7–7.7)
Neutrophils Relative %: 47 % (ref 43–77)
Platelets: 278 10*3/uL (ref 150–400)
RBC: 5.24 MIL/uL — ABNORMAL HIGH (ref 3.87–5.11)
RDW: 14.5 % (ref 11.5–15.5)
WBC: 5.9 10*3/uL (ref 4.0–10.5)

## 2015-07-12 LAB — COMPLETE METABOLIC PANEL WITH GFR
ALT: 22 U/L (ref 6–29)
AST: 22 U/L (ref 10–35)
Albumin: 4 g/dL (ref 3.6–5.1)
Alkaline Phosphatase: 105 U/L (ref 33–130)
BUN: 13 mg/dL (ref 7–25)
CHLORIDE: 104 mmol/L (ref 98–110)
CO2: 29 mmol/L (ref 20–31)
Calcium: 9.3 mg/dL (ref 8.6–10.4)
Creat: 0.87 mg/dL (ref 0.50–1.05)
GFR, EST AFRICAN AMERICAN: 87 mL/min (ref >=60)
GFR, EST NON AFRICAN AMERICAN: 76 mL/min (ref >=60)
Glucose, Bld: 92 mg/dL (ref 70–99)
Potassium: 3.9 mmol/L (ref 3.5–5.3)
Sodium: 139 mmol/L (ref 135–146)
Total Bilirubin: 0.4 mg/dL (ref 0.2–1.2)
Total Protein: 7 g/dL (ref 6.1–8.1)

## 2015-07-12 LAB — LIPID PANEL
Cholesterol: 200 mg/dL (ref 125–200)
HDL: 53 mg/dL (ref >=46)
LDL Cholesterol: 129 mg/dL (ref <130)
TRIGLYCERIDES: 91 mg/dL (ref <150)
Total CHOL/HDL Ratio: 3.8 Ratio (ref <=5.0)
VLDL: 18 mg/dL (ref <30)

## 2015-07-12 LAB — TSH: TSH: 1.299 u[IU]/mL (ref 0.350–4.500)

## 2015-07-12 NOTE — Progress Notes (Signed)
Patient ID: Stassi Fadely MRN: [id number], DOB: [birth year], 54 y.o. Date of Encounter: 07/12/2015,   Chief Complaint: Physical (CPE)  HPI: 54 y.o. y/o AA female  here for CPE.   Her last complete physical exam was with me on 06/10/2013. At that visit, she was a new patient to our office and was here to establish care.  She said that she saw Dr. Glo Herring for her GYN care in the past. She thought that her last visit with him was about 2 years prior to that [place] with me in 2014. Otherwise she had been going to just an urgent care for any other needed medical care. She did not think she has had her cholesterol or any other screening labs done in many years.  She had no known medical problems. She reported that she had h/o uterine fibroids. This was the reason that she had a complete hysterectomy. No other surgeries other than complete hysterectomy and tubal ligation.  Since that visit, she has been diagnosed with hypertension by me and is being treated for hypertension.  At her complete physical exam 06/2013 she did have complete screening labs. These were all normal. See assessment/plan at the end of the note regarding other preventive care.  She states that she is no longer needing to take the omeprazole. It says that she has no heartburn symptoms or acid reflux.   Review of Systems: Consitutional: No fever, chills, fatigue, night sweats, lymphadenopathy. No significant/unexplained weight changes. Eyes: No visual changes, eye redness, or discharge. ENT/Mouth: No ear pain, sore throat, nasal drainage, or sinus pain. Cardiovascular: No chest pressure,heaviness, tightness or squeezing, even with exertion. No increased shortness of breath or dyspnea on exertion.No palpitations, edema, orthopnea, PND. Respiratory: No cough, hemoptysis, SOB, or wheezing. Gastrointestinal: No anorexia, dysphagia, reflux, pain, nausea, vomiting, hematemesis, diarrhea, constipation, BRBPR, or melena. Breast: No  mass, nodules, bulging, or retraction. No skin changes or inflammation. No nipple discharge. No lymphadenopathy. Genitourinary: No dysuria, hematuria, incontinence, vaginal discharge, pruritis, burning, abnormal bleeding, or pain. Musculoskeletal: No decreased ROM, No joint pain or swelling. No significant pain in neck, back, or extremities. Skin: No rash, pruritis, or concerning lesions. Neurological: No headache, dizziness, syncope, seizures, tremors, memory loss, coordination problems, or paresthesias. Psychological: No anxiety, depression, hallucinations, SI/HI. Endocrine: No polydipsia, polyphagia, polyuria, or known diabetes.No increased fatigue. No palpitations/rapid heart rate. No significant/unexplained weight change. All other systems were reviewed and are otherwise negative.  Past Medical History  Diagnosis Date  . Hypertension      Past Surgical History  Procedure Laterality Date  . Tubal ligation    . Total abdominal hysterectomy      Secondary to fibroids-No cancer    Home Meds:  Outpatient Prescriptions Prior to Visit  Medication Sig Dispense Refill  . amLODipine (NORVASC) 5 MG tablet TAKE 1 TABLET BY MOUTH DAILY 30 tablet 0  . cetirizine (ZYRTEC) 10 MG tablet TAKE 1 TABLET BY MOUTH EVERY DAY 30 tablet 2  . hydrochlorothiazide (MICROZIDE) 12.5 MG capsule TAKE ONE CAPSULE EVERY DAY 30 capsule 0  . Multiple Vitamins-Minerals (MULTIVITAMIN PO) Take by mouth.    Marland Kitchen omeprazole (PRILOSEC) 20 MG capsule Take 1 capsule (20 mg total) by mouth daily. 30 capsule 3   No facility-administered medications prior to visit.      Allergies:  Allergies  Allergen Reactions  . Benazepril     Cough     Social History   Social History  . Marital Status: Single    Spouse Name:  N/A  . Number of Children: N/A  . Years of Education: N/A   Occupational History  . Not on file.   Social History Main Topics  . Smoking status: Never Smoker   . Smokeless tobacco: Never Used  .  Alcohol Use: No  . Drug Use: No  . Sexual Activity: No   Other Topics Concern  . 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   3 children: Ages 74,30, 36 --all out of house    Family History  Problem Relation Age of Onset  . Cancer Father     ? Type  . Colon cancer Neg Hx     Physical Exam: Blood pressure 124/78, pulse 68, temperature 98.6 F (37 C), temperature source Oral, resp. rate 18, height 5' 6.5" (1.689 m), weight 179 lb (81.194 kg)., Body mass index is 28.46 kg/(m^2). General: Well developed, well nourished, African American female. in no acute distress. HEENT: Normocephalic, atraumatic. Conjunctiva pink, sclera non-icteric. Pupils 2 mm constricting to 1 mm, round, regular, and equally reactive to light and accomodation. EOMI. Internal auditory canal clear. TMs with good cone of light and without pathology. Nasal mucosa pink. Nares are without discharge. No sinus tenderness. Oral mucosa pink.  Pharynx without exudate.   Neck: Supple. Trachea midline. No thyromegaly. Full ROM. No lymphadenopathy.No Carotid Bruits. Lungs: Clear to auscultation bilaterally without wheezes, rales, or rhonchi. Breathing is of normal effort and unlabored. Cardiovascular: RRR with S1 S2. No murmurs, rubs, or gallops. Distal pulses 2+ symmetrically. No carotid or abdominal bruits. Breast: Symmetrical. No masses. Nipples without discharge. Abdomen: Soft, non-tender, non-distended with normoactive bowel sounds. No hepatosplenomegaly or masses. No rebound/guarding. No CVA tenderness. No hernias.  Genitourinary:  External genitalia without lesions. Vaginal mucosa pink.No discharge present. Mucosa is normal. Bimanual exam is normal with no mass. Musculoskeletal: Full range of motion and 5/5 strength throughout. Without swelling, atrophy, tenderness, crepitus, or warmth. Extremities without clubbing,  cyanosis, or edema. Calves supple. Skin: Warm and moist without erythema, ecchymosis, wounds, or rash. Neuro: A+Ox3. CN II-XII grossly intact. Moves all extremities spontaneously. Full sensation throughout. Normal gait. DTR 2+ throughout upper and lower extremities. Finger to nose intact. Psych:  Responds to questions appropriately with a normal affect.   Assessment/Plan:  54 y.o. y/o female here for CPE 1. Visit for preventive health examination  A. Screening Labs:  06/2013 she did full panel of screening labs which were all normal. CBC--Normal CMET--Normal TSH--Normal FLP--Normal     LDL= 124 Vitamin D = 36 -normal.  B. No pap smear indicated since she has had hysterectomy secondary to fibroids and no history of cancer.  C. screening mammogram: Patient says she did follow up with mammogram after her physical with me and had it in October 2015. She is aware that she will need followup annually and will schedule followup .At CPE 07/12/15--says that she has been working a lot of overtime and has not had time to come in for her physical until today and has not had time to do her mammogram yet but is going to call them today to schedule this. Always has her physical and mammogram right together near the same time.  D. Screening colonoscopy: At her OV 06/2013 she reported she had never had this. I referred her to GI.  She had a screening colonoscopy performed by Dr. Carlean Purl 07/01/2013. He is with [facility] so this is documented in Health Maintenance by him.  E. Immunizations:  Tdap given here 06/2013  Influenza vaccine given here 06/2013, 06/2014. Patient agreeable to up date with influenza vaccine today. Pneumonia vaccine--she has no indication to need this prior to age 64 Zostavax-will discuss at age 63    Signed, [place], [place], [facility] 07/12/2015 8:46 AM

## 2015-07-13 ENCOUNTER — Encounter: Payer: Self-pay | Admitting: Family Medicine

## 2015-07-13 LAB — VITAMIN D 25 HYDROXY (VIT D DEFICIENCY, FRACTURES): Vit D, 25-Hydroxy: 27 ng/mL — ABNORMAL LOW (ref 30–100)

## 2015-07-27 ENCOUNTER — Other Ambulatory Visit: Payer: Self-pay | Admitting: Physician Assistant

## 2015-07-27 NOTE — Telephone Encounter (Signed)
Medication refilled per protocol. 

## 2015-08-10 ENCOUNTER — Ambulatory Visit
Admission: RE | Admit: 2015-08-10 | Discharge: 2015-08-10 | Disposition: A | Discharge status: Home / Self Care | Payer: BLUE CROSS/BLUE SHIELD | Source: Ambulatory Visit

## 2015-08-10 DIAGNOSIS — Z1231 Encounter for screening mammogram for malignant neoplasm of breast: Secondary | ICD-10-CM

## 2015-08-10 LAB — HM MAMMOGRAPHY: HM Mammogram: NORMAL

## 2015-08-14 ENCOUNTER — Encounter: Payer: Self-pay | Admitting: Family Medicine

## 2015-11-27 ENCOUNTER — Telehealth: Payer: Self-pay | Admitting: Physician Assistant

## 2015-11-27 NOTE — Telephone Encounter (Signed)
Patient is calling to discuss her zyrtec with you  And the price  Please call her at [phone]

## 2015-11-29 NOTE — Telephone Encounter (Signed)
LMTCB

## 2015-12-01 NOTE — Telephone Encounter (Signed)
Pt came to office.  Says Zyrtec Rx price has gone up,  Wants to know what else can be ordered.  Told her she needs to get that information from her insurance

## 2016-01-31 ENCOUNTER — Other Ambulatory Visit: Payer: Self-pay | Admitting: Physician Assistant

## 2016-07-04 ENCOUNTER — Ambulatory Visit: Payer: BLUE CROSS/BLUE SHIELD

## 2016-07-18 ENCOUNTER — Encounter: Payer: Self-pay | Admitting: Physician Assistant

## 2016-07-18 ENCOUNTER — Ambulatory Visit (INDEPENDENT_AMBULATORY_CARE_PROVIDER_SITE_OTHER): Payer: BLUE CROSS/BLUE SHIELD | Admitting: Physician Assistant

## 2016-07-18 VITALS — BP 122/82 | HR 73 | Temp 97.9°F | Resp 16 | Wt 173.0 lb

## 2016-07-18 DIAGNOSIS — Z0000 Encounter for general adult medical examination without abnormal findings: Secondary | ICD-10-CM | POA: Diagnosis not present

## 2016-07-18 DIAGNOSIS — N952 Postmenopausal atrophic vaginitis: Secondary | ICD-10-CM | POA: Insufficient documentation

## 2016-07-18 DIAGNOSIS — N760 Acute vaginitis: Secondary | ICD-10-CM

## 2016-07-18 DIAGNOSIS — J301 Allergic rhinitis due to pollen: Secondary | ICD-10-CM | POA: Diagnosis not present

## 2016-07-18 DIAGNOSIS — I10 Essential (primary) hypertension: Secondary | ICD-10-CM

## 2016-07-18 DIAGNOSIS — Z23 Encounter for immunization: Secondary | ICD-10-CM

## 2016-07-18 LAB — CBC WITH DIFFERENTIAL/PLATELET
Basophils Absolute: 0 cells/uL (ref 0–200)
Basophils Relative: 0 %
EOS PCT: 2 %
Eosinophils Absolute: 126 cells/uL (ref 15–500)
HCT: 45.3 % — ABNORMAL HIGH (ref 35.0–45.0)
Hemoglobin: 14.5 g/dL (ref 12.0–15.0)
LYMPHS ABS: 2142 {cells}/uL (ref 850–3900)
LYMPHS PCT: 34 %
MCH: 26.7 pg — ABNORMAL LOW (ref 27.0–33.0)
MCHC: 32 g/dL (ref 32.0–36.0)
MCV: 83.4 fL (ref 80.0–100.0)
MPV: 10.7 fL (ref 7.5–12.5)
Monocytes Absolute: 441 cells/uL (ref 200–950)
Monocytes Relative: 7 %
Neutro Abs: 3591 cells/uL (ref 1500–7800)
Neutrophils Relative %: 57 %
Platelets: 278 10*3/uL (ref 140–400)
RBC: 5.43 MIL/uL — AB (ref 3.80–5.10)
RDW: 14 % (ref 11.0–15.0)
WBC: 6.3 10*3/uL (ref 3.8–10.8)

## 2016-07-18 LAB — COMPLETE METABOLIC PANEL WITH GFR
ALT: 21 U/L (ref 6–29)
AST: 21 U/L (ref 10–35)
Albumin: 4.2 g/dL (ref 3.6–5.1)
Alkaline Phosphatase: 91 U/L (ref 33–130)
BUN: 13 mg/dL (ref 7–25)
CHLORIDE: 102 mmol/L (ref 98–110)
CO2: 28 mmol/L (ref 20–31)
Calcium: 9.4 mg/dL (ref 8.6–10.4)
Creat: 0.97 mg/dL (ref 0.50–1.05)
GFR, Est African American: 76 mL/min (ref >=60)
GFR, Est Non African American: 66 mL/min (ref >=60)
GLUCOSE: 82 mg/dL (ref 70–99)
POTASSIUM: 4.2 mmol/L (ref 3.5–5.3)
SODIUM: 139 mmol/L (ref 135–146)
Total Bilirubin: 0.4 mg/dL (ref 0.2–1.2)
Total Protein: 7.1 g/dL (ref 6.1–8.1)

## 2016-07-18 LAB — WET PREP FOR TRICH, YEAST, CLUE
Clue Cells Wet Prep HPF POC: NONE SEEN
TRICH WET PREP: NONE SEEN
Yeast Wet Prep HPF POC: NONE SEEN

## 2016-07-18 LAB — LIPID PANEL
CHOL/HDL RATIO: 3.3 ratio (ref <5.0)
Cholesterol: 211 mg/dL — ABNORMAL HIGH (ref <200)
HDL: 63 mg/dL (ref >50)
LDL CALC: 130 mg/dL — AB
TRIGLYCERIDES: 89 mg/dL (ref <150)
VLDL: 18 mg/dL (ref <30)

## 2016-07-18 LAB — TSH: TSH: 2.15 m[IU]/L

## 2016-07-18 MED ORDER — ESTRADIOL 0.1 MG/GM VA CREA: 1.0000 | TOPICAL_CREAM | Freq: Every day | VAGINAL | 12 refills | Status: DC

## 2016-07-18 NOTE — Progress Notes (Signed)
Patient ID: Keylen Kagan MRN: [id number], DOB: [birth year], 55 y.o. Date of Encounter: 07/18/2016,   Chief Complaint: Physical (CPE)  HPI: 55 y.o. y/o AA female  here for CPE.    07/12/2015: She had complete physical exam was with me on 06/10/2013. At that visit, she was a new patient to our office and was here to establish care.  She said that she saw Dr. Glo Herring for her GYN care in the past. She thought that her last visit with him was about 2 years prior to that [place] with me in 2014. Otherwise she had been going to just an urgent care for any other needed medical care. She did not think she has had her cholesterol or any other screening labs done in many years.  She had no known medical problems. She reported that she had h/o uterine fibroids. This was the reason that she had a complete hysterectomy. No other surgeries other than complete hysterectomy and tubal ligation.  Since that visit, she has been diagnosed with hypertension by me and is being treated for hypertension.  At her complete physical exam 06/2013 she did have complete screening labs. These were all normal. See assessment/plan at the end of the note regarding other preventive care.  She states that she is no longer needing to take the omeprazole. It says that she has no heartburn symptoms or acid reflux.   07/18/2016: She reports that she has recently noticed a little bit of vaginal irritation. Doesn't know if that she is secondary to dryness/atrophic vaginitis or whether it could be something else. No other complaints or concerns today. Otherwise, has been feeling well over the past year. She is taking blood pressure medications as directed with no adverse effects.  Review of Systems: Consitutional: No fever, chills, fatigue, night sweats, lymphadenopathy. No significant/unexplained weight changes. Eyes: No visual changes, eye redness, or discharge. ENT/Mouth: No ear pain, sore throat, nasal drainage, or sinus  pain. Cardiovascular: No chest pressure,heaviness, tightness or squeezing, even with exertion. No increased shortness of breath or dyspnea on exertion.No palpitations, edema, orthopnea, PND. Respiratory: No cough, hemoptysis, SOB, or wheezing. Gastrointestinal: No anorexia, dysphagia, reflux, pain, nausea, vomiting, hematemesis, diarrhea, constipation, BRBPR, or melena. Breast: No mass, nodules, bulging, or retraction. No skin changes or inflammation. No nipple discharge. No lymphadenopathy. Genitourinary: No dysuria, hematuria, incontinence, vaginal discharge, pruritis, burning, abnormal bleeding, or pain. Musculoskeletal: No decreased ROM, No joint pain or swelling. No significant pain in neck, back, or extremities. Skin: No rash, pruritis, or concerning lesions. Neurological: No headache, dizziness, syncope, seizures, tremors, memory loss, coordination problems, or paresthesias. Psychological: No anxiety, depression, hallucinations, SI/HI. Endocrine: No polydipsia, polyphagia, polyuria, or known diabetes.No increased fatigue. No palpitations/rapid heart rate. No significant/unexplained weight change. All other systems were reviewed and are otherwise negative.  Past Medical History:  Diagnosis Date  . Hypertension      Past Surgical History:  Procedure Laterality Date  . TOTAL ABDOMINAL HYSTERECTOMY     Secondary to fibroids-No cancer  . TUBAL LIGATION      Home Meds:  Outpatient Medications Prior to Visit  Medication Sig Dispense Refill  . amLODipine (NORVASC) 5 MG tablet TAKE 1 TABLET BY MOUTH EVERY DAY 90 tablet 1  . cetirizine (ZYRTEC) 10 MG tablet TAKE 1 TABLET BY MOUTH EVERY DAY 90 tablet 1  . hydrochlorothiazide (MICROZIDE) 12.5 MG capsule TAKE ONE CAPSULE BY MOUTH EVERY DAY 90 capsule 1  . Multiple Vitamins-Minerals (MULTIVITAMIN PO) Take by mouth.     No  facility-administered medications prior to visit.       Allergies:  Allergies  Allergen Reactions  . Benazepril      Cough     Social History   Social History  . Marital status: Single    Spouse name: N/A  . Number of children: N/A  . Years of education: N/A   Occupational History  . Not on file.   Social History Main Topics  . Smoking status: Never Smoker  . Smokeless tobacco: Never Used  . Alcohol use No  . Drug use: No  . Sexual activity: No   Other Topics Concern  . 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   3 children: Ages 60,30, 74 --all out of house    Family History  Problem Relation Age of Onset  . Cancer Father     ? Type  . Colon cancer Neg Hx     Physical Exam: Blood pressure 122/82, pulse 73, temperature 97.9 F (36.6 C), temperature source Oral, resp. rate 16, weight 173 lb (78.5 kg), SpO2 99 %., Body mass index is 27.5 kg/m. General: Well developed, well nourished, African American female. in no acute distress. HEENT: Normocephalic, atraumatic. Conjunctiva pink, sclera non-icteric. Pupils 2 mm constricting to 1 mm, round, regular, and equally reactive to light and accomodation. EOMI. Internal auditory canal clear. TMs with good cone of light and without pathology. Nasal mucosa pink. Nares are without discharge. No sinus tenderness. Oral mucosa pink.  Pharynx without exudate.   Neck: Supple. Trachea midline. No thyromegaly. Full ROM. No lymphadenopathy.No Carotid Bruits. Lungs: Clear to auscultation bilaterally without wheezes, rales, or rhonchi. Breathing is of normal effort and unlabored. Cardiovascular: RRR with S1 S2. No murmurs, rubs, or gallops. Distal pulses 2+ symmetrically. No carotid or abdominal bruits. Breast: Symmetrical. No masses. Nipples without discharge. Abdomen: Soft, non-tender, non-distended with normoactive bowel sounds. No hepatosplenomegaly or masses. No rebound/guarding. No CVA tenderness. No hernias.  Genitourinary:  External  genitalia without lesions. Vaginal mucosa pink.No discharge present. Mucosa is normal. Bimanual exam is normal with no mass, no tenderness. Musculoskeletal: Full range of motion and 5/5 strength throughout. Skin: Warm and moist without erythema, ecchymosis, wounds, or rash. Neuro: A+Ox3. CN II-XII grossly intact. Moves all extremities spontaneously. Full sensation throughout. Normal gait. DTR 2+ throughout upper and lower extremities.  Psych:  Responds to questions appropriately with a normal affect.   Assessment/Plan:  55 y.o. y/o female here for CPE 1. Visit for preventive health examination  A. Screening Labs:  07/18/2016: She is fasting today. Check screening labs now.  B. No pap smear indicated since she has had hysterectomy secondary to fibroids and no history of cancer.  C. screening mammogram: Patient says she did follow up with mammogram after her physical with me and had it in October 2015. She is aware that she will need followup annually and will schedule followup .At CPE 07/12/15--says that she has been working a lot of overtime and has not had time to come in for her physical until today and has not had time to do her mammogram yet but is going to call them today to schedule this. Always has her physical and mammogram right together near the same time. 07/18/2016----She reports that she has this scheduled coming up later this month.  D. Screening colonoscopy: At her OV 06/2013 she reported she had never had this.  I referred her to GI.                                              She had a screening colonoscopy performed by Dr. Carlean Purl 07/01/2013. He is with [facility] so this is documented in Health Maintenance by him.  E. Immunizations:  Tdap given here 06/2013  Influenza vaccine given here 06/2013, 06/2014.   --07/18/2016-- Patient agreeable to up date with influenza vaccine today.---Given here 07/18/2016 Pneumonia vaccine--she has no indication to need this prior to age  55 Zostavax-will discuss at age 34  2. Essential hypertension 07/18/2016: Blood Pressure is well controlled/at goal. Continue current medications. Check lab to monitor. - COMPLETE METABOLIC PANEL WITH GFR  3. Acute nonseasonal allergic rhinitis due to pollen 07/18/2016: She uses Zyrtec daily when needed for this and this controls the symptoms.  4. Vaginitis and vulvovaginitis 07/18/2016---wet prep reviewed palpation here and this is negative. Send G/C - WET PREP FOR TRICH, YEAST, CLUE - GC/Chlamydia Probe Amp  5. Atrophic vaginitis Discussed with patient that her physical exam shows no significant amount of discharge present and the wet prep is negative. Findings consistent with atrophic vaginitis. Asked her how much this is irritating her and she says quite a bit. Says that she has been feeling this vaginal irritation for quite a while. Discussed using estrogen cream and she is agreeable. She is to use this every night for a couple of weeks and then can go to taking using this just 2-3 nights a week. Told her to get Replense which is over-the-counter and use this every morning. - estradiol (ESTRACE VAGINAL) 0.1 MG/GM vaginal cream; Place 1 Applicatorful vaginally at bedtime.  Dispense: 42.5 g; Refill: 12  6. Flu vaccine need - Flu Vaccine QUAD 36+ mos IM   Signed, [address] [place], [place], [facility] 07/18/2016 9:58 AM

## 2016-07-19 LAB — GC/CHLAMYDIA PROBE AMP
CT Probe RNA: NOT DETECTED
GC PROBE AMP APTIMA: NOT DETECTED

## 2016-07-19 LAB — VITAMIN D 25 HYDROXY (VIT D DEFICIENCY, FRACTURES): Vit D, 25-Hydroxy: 19 ng/mL — ABNORMAL LOW (ref 30–100)

## 2016-07-22 ENCOUNTER — Other Ambulatory Visit: Payer: Self-pay | Admitting: Physician Assistant

## 2016-07-22 DIAGNOSIS — Z1231 Encounter for screening mammogram for malignant neoplasm of breast: Secondary | ICD-10-CM

## 2016-07-23 DIAGNOSIS — E559 Vitamin D deficiency, unspecified: Secondary | ICD-10-CM | POA: Insufficient documentation

## 2016-07-23 MED ORDER — CHOLECALCIFEROL 25 MCG (1000 UT) PO TABS: 1000.0000 [IU] | ORAL_TABLET | Freq: Every day | ORAL | 3 refills | Status: DC

## 2016-07-23 NOTE — Addendum Note (Signed)
Addended by: Vonna Kotyk A on: 07/23/2016 12:44 PM   Modules accepted: Orders

## 2016-08-11 ENCOUNTER — Other Ambulatory Visit: Payer: Self-pay | Admitting: Physician Assistant

## 2016-08-12 NOTE — Telephone Encounter (Signed)
Rx filled per protocol  

## 2016-08-27 ENCOUNTER — Ambulatory Visit
Admission: RE | Admit: 2016-08-27 | Discharge: 2016-08-27 | Disposition: A | Discharge status: Home / Self Care | Payer: BLUE CROSS/BLUE SHIELD | Source: Ambulatory Visit | Attending: Physician Assistant | Admitting: Physician Assistant

## 2016-08-27 DIAGNOSIS — Z1231 Encounter for screening mammogram for malignant neoplasm of breast: Secondary | ICD-10-CM

## 2017-07-21 ENCOUNTER — Encounter: Payer: Self-pay | Admitting: Physician Assistant

## 2017-07-21 ENCOUNTER — Encounter: Payer: BLUE CROSS/BLUE SHIELD | Admitting: Physician Assistant

## 2017-07-23 ENCOUNTER — Encounter: Payer: Self-pay | Admitting: Physician Assistant

## 2017-07-23 ENCOUNTER — Other Ambulatory Visit: Payer: Self-pay

## 2017-07-23 ENCOUNTER — Ambulatory Visit (INDEPENDENT_AMBULATORY_CARE_PROVIDER_SITE_OTHER): Payer: BLUE CROSS/BLUE SHIELD | Admitting: Physician Assistant

## 2017-07-23 VITALS — BP 124/92 | HR 71 | Temp 97.7°F | Resp 16 | Ht 66.5 in | Wt 173.4 lb

## 2017-07-23 DIAGNOSIS — N952 Postmenopausal atrophic vaginitis: Secondary | ICD-10-CM | POA: Diagnosis not present

## 2017-07-23 DIAGNOSIS — Z23 Encounter for immunization: Secondary | ICD-10-CM | POA: Diagnosis not present

## 2017-07-23 DIAGNOSIS — Z114 Encounter for screening for human immunodeficiency virus [HIV]: Secondary | ICD-10-CM | POA: Diagnosis not present

## 2017-07-23 DIAGNOSIS — E559 Vitamin D deficiency, unspecified: Secondary | ICD-10-CM

## 2017-07-23 DIAGNOSIS — J301 Allergic rhinitis due to pollen: Secondary | ICD-10-CM | POA: Diagnosis not present

## 2017-07-23 DIAGNOSIS — I10 Essential (primary) hypertension: Secondary | ICD-10-CM | POA: Diagnosis not present

## 2017-07-23 DIAGNOSIS — Z1159 Encounter for screening for other viral diseases: Secondary | ICD-10-CM

## 2017-07-23 DIAGNOSIS — Z0000 Encounter for general adult medical examination without abnormal findings: Secondary | ICD-10-CM | POA: Diagnosis not present

## 2017-07-23 MED ORDER — ESTRADIOL 0.1 MG/GM VA CREA: 1.0000 | TOPICAL_CREAM | Freq: Every day | VAGINAL | 12 refills | Status: DC

## 2017-07-23 MED ORDER — HYDROCHLOROTHIAZIDE 12.5 MG PO CAPS: 12.5000 mg | ORAL_CAPSULE | Freq: Every day | ORAL | 3 refills | Status: DC

## 2017-07-23 MED ORDER — AMLODIPINE BESYLATE 5 MG PO TABS: 5.0000 mg | ORAL_TABLET | Freq: Every day | ORAL | 3 refills | Status: DC

## 2017-07-23 MED ORDER — CETIRIZINE HCL 10 MG PO TABS: 10.0000 mg | ORAL_TABLET | Freq: Every day | ORAL | 1 refills | Status: DC

## 2017-07-23 NOTE — Progress Notes (Signed)
Patient ID: Amber Boone MRN: [id number], DOB: 17-Jul-1961, 56 y.o. Date of Encounter: 07/23/2017,   Chief Complaint: Physical (CPE)  HPI: 56 y.o. y/o AA female  here for CPE.    07/12/2015: She had complete physical exam was with me on 06/10/2013. At that visit, she was a new patient to our office and was here to establish care.  She said that she saw Dr. Glo Herring for her GYN care in the past. She thought that her last visit with him was about 2 years prior to that [place] with me in 2014. Otherwise she had been going to just an urgent care for any other needed medical care. She did not think she has had her cholesterol or any other screening labs done in many years.  She had no known medical problems. She reported that she had h/o uterine fibroids. This was the reason that she had a complete hysterectomy. No other surgeries other than complete hysterectomy and tubal ligation.  Since that visit, she has been diagnosed with hypertension by me and is being treated for hypertension.  At her complete physical exam 06/2013 she did have complete screening labs. These were all normal. See assessment/plan at the end of the note regarding other preventive care.  She states that she is no longer needing to take the omeprazole. Says that she has no heartburn symptoms or acid reflux.   07/18/2016: She reports that she has recently noticed a little bit of vaginal irritation. Doesn't know if that she is secondary to dryness/atrophic vaginitis or whether it could be something else. No other complaints or concerns today. Otherwise, has been feeling well over the past year. She is taking blood pressure medications as directed with no adverse effects.   07/23/2017: She presents for complete physical exam again today.  She has no specific concerns to address today.  She states that she has been feeling good.  Also states that she has no updates regarding family medical history to report. I asked what she  keeps herself busy with--she replies--work, picking up grandkids, helping take care of uncle.  Says her grandchildren are ages 73 and age 84.   Review of Systems: Consitutional: No fever, chills, fatigue, night sweats, lymphadenopathy. No significant/unexplained weight changes. Eyes: No visual changes, eye redness, or discharge. ENT/Mouth: No ear pain, sore throat, nasal drainage, or sinus pain. Cardiovascular: No chest pressure,heaviness, tightness or squeezing, even with exertion. No increased shortness of breath or dyspnea on exertion.No palpitations, edema, orthopnea, PND. Respiratory: No cough, hemoptysis, SOB, or wheezing. Gastrointestinal: No anorexia, dysphagia, reflux, pain, nausea, vomiting, hematemesis, diarrhea, constipation, BRBPR, or melena. Breast: No mass, nodules, bulging, or retraction. No skin changes or inflammation. No nipple discharge. No lymphadenopathy. Genitourinary: No dysuria, hematuria, incontinence, vaginal discharge, pruritis, burning, abnormal bleeding, or pain. Musculoskeletal: No decreased ROM, No joint pain or swelling. No significant pain in neck, back, or extremities. Skin: No rash, pruritis, or concerning lesions. Neurological: No headache, dizziness, syncope, seizures, tremors, memory loss, coordination problems, or paresthesias. Psychological: No anxiety, depression, hallucinations, SI/HI. Endocrine: No polydipsia, polyphagia, polyuria, or known diabetes.No increased fatigue. No palpitations/rapid heart rate. No significant/unexplained weight change. All other systems were reviewed and are otherwise negative.  Past Medical History:  Diagnosis Date  . Hypertension      Past Surgical History:  Procedure Laterality Date  . TOTAL ABDOMINAL HYSTERECTOMY     Secondary to fibroids-No cancer  . TUBAL LIGATION      Home Meds:  Outpatient Medications Prior to Visit  Medication Sig Dispense Refill  . amLODipine (NORVASC) 5 MG tablet TAKE 1 TABLET BY MOUTH  EVERY DAY 90 tablet 3  . Cholecalciferol (GNP VITAMIN D) 1000 units tablet Take 1 tablet (1,000 Units total) by mouth daily. 90 tablet 3  . hydrochlorothiazide (MICROZIDE) 12.5 MG capsule TAKE ONE CAPSULE BY MOUTH EVERY DAY 90 capsule 3  . Multiple Vitamins-Minerals (MULTIVITAMIN PO) Take by mouth.    . cetirizine (ZYRTEC) 10 MG tablet TAKE 1 TABLET BY MOUTH EVERY DAY (Patient not taking: Reported on 07/23/2017) 90 tablet 1  . estradiol (ESTRACE VAGINAL) 0.1 MG/GM vaginal cream Place 1 Applicatorful vaginally at bedtime. (Patient not taking: Reported on 07/23/2017) 42.5 g 12   No facility-administered medications prior to visit.       Allergies:  Allergies  Allergen Reactions  . Benazepril     Cough     Social History   Socioeconomic History  . Marital status: Single    Spouse name: Not on file  . Number of children: Not on file  . Years of education: Not on file  . Highest education level: Not on file  Social Needs  . Financial resource strain: Not on file  . Food insecurity - worry: Not on file  . Food insecurity - inability: Not on file  . Transportation needs - medical: Not on file  . Transportation needs - non-medical: Not on file  Occupational History  . Not on file  Tobacco Use  . Smoking status: Never Smoker  . Smokeless tobacco: Never Used  Substance and Sexual Activity  . Alcohol use: No  . Drug use: No  . Sexual activity: No    Birth control/protection: Surgical  Other Topics Concern  . 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   3 children: Ages 67,30, 56 --all out of house    Family History  Problem Relation Age of Onset  . Cancer Father        ? Type  . Colon cancer Neg Hx     Physical Exam: Blood pressure (!) 124/92, pulse 71, temperature 97.7 F (36.5 C), temperature source Oral, resp. rate 16, height 5' 6.5" (1.689 m), weight 78.7 kg  (173 lb 6.4 oz), SpO2 98 %., There is no height or weight on file to calculate BMI. General: Well developed, well nourished, African American female. in no acute distress. HEENT: Normocephalic, atraumatic. Conjunctiva pink, sclera non-icteric. Pupils 2 mm constricting to 1 mm, round, regular, and equally reactive to light and accomodation. EOMI. Internal auditory canal clear. TMs with good cone of light and without pathology. Nasal mucosa pink. Nares are without discharge. No sinus tenderness. Oral mucosa pink.  Pharynx without exudate.  Dentition is poor.  She has multiple visible cavities.  Neck: Supple. Trachea midline. No thyromegaly. Full ROM. No lymphadenopathy.No Carotid Bruits. Lungs: Clear to auscultation bilaterally without wheezes, rales, or rhonchi. Breathing is of normal effort and unlabored. Cardiovascular: RRR with S1 S2. No murmurs, rubs, or gallops. Distal pulses 2+ symmetrically. No carotid or abdominal bruits. Breast: Symmetrical. No masses. Nipples without discharge. Abdomen: Soft, non-tender, non-distended with normoactive bowel sounds. No hepatosplenomegaly or masses. No rebound/guarding. No CVA tenderness. No hernias.  Genitourinary:  External genitalia without lesions. Vaginal mucosa pink.No discharge present. Mucosa is normal. Bimanual exam is normal with no mass, no tenderness. Musculoskeletal: Full range of motion and 5/5 strength throughout. Skin: Warm  and moist without erythema, ecchymosis, wounds, or rash. Neuro: A+Ox3. CN II-XII grossly intact. Moves all extremities spontaneously. Full sensation throughout. Normal gait.   Psych:  Responds to questions appropriately with a normal affect.   Assessment/Plan:  56 y.o. y/o female here for CPE 1. Visit for preventive health examination  A. Screening Labs:  07/23/2017: She is fasting today. Check screening labs now.  B. No pap smear indicated since she has had hysterectomy secondary to fibroids and no history of  cancer.  C. screening mammogram:  07/23/2017: Mammogram was 08/27/2016--- negative.  She plans to schedule follow-up mammogram after December 19.  D. Screening colonoscopy: At her OV 06/2013 she reported she had never had this. I referred her to GI.                                              She had a screening colonoscopy performed by Dr. Carlean Purl 07/01/2013. He is with [facility] so this is documented in Health Maintenance by him.  E. Immunizations:  Tdap given here 06/2013  Influenza vaccine given here 06/2013, 06/2014, 07/18/2016   --07/23/2017-- Patient agreeable to up date with influenza vaccine today.---Given here 07/23/2017 Pneumonia vaccine--she has no indication to need this prior to age 36 Shingrix: 07/23/2017: discussed.  She is to check with her insurance regarding cost/coverage.  Essential hypertension 07/23/2017: Blood Pressure is well controlled/at goal. Continue current medications. Check lab to monitor. - COMPLETE METABOLIC PANEL WITH GFR  Acute nonseasonal allergic rhinitis due to pollen 07/23/2017: She uses Zyrtec daily when needed for this and this controls the symptoms.  Atrophic vaginitis 07/2016: Discussed with patient that her physical exam shows no significant amount of discharge present and the wet prep is negative. Findings consistent with atrophic vaginitis. Asked her how much this is irritating her and she says quite a bit. Says that she has been feeling this vaginal irritation for quite a while. Discussed using estrogen cream and she is agreeable. She is to use this every night for a couple of weeks and then can go to taking using this just 2-3 nights a week. Told her to get Replense which is over-the-counter and use this every morning. - estradiol (ESTRACE VAGINAL) 0.1 MG/GM vaginal cream; Place 1 Applicatorful vaginally at bedtime.  Dispense: 42.5 g; Refill: 12 07/23/2017: Reports that the estrogen vaginal cream has been working well and this is well  controlled now.  Poor Dentition Needs follow-up with dentist.  Flu vaccine need 07/23/2017---Give flu vaccine today. Pt agreeable.   Signed, [address] [place], [place], [facility] 07/23/2017 10:44 AM

## 2017-07-24 LAB — CBC WITH DIFFERENTIAL/PLATELET
BASOS ABS: 30 {cells}/uL (ref 0–200)
Basophils Relative: 0.5 %
EOS ABS: 177 {cells}/uL (ref 15–500)
Eosinophils Relative: 3 %
HEMATOCRIT: 45.3 % — AB (ref 35.0–45.0)
Hemoglobin: 15 g/dL (ref 11.7–15.5)
Lymphs Abs: 2348 cells/uL (ref 850–3900)
MCH: 26.7 pg — AB (ref 27.0–33.0)
MCHC: 33.1 g/dL (ref 32.0–36.0)
MCV: 80.6 fL (ref 80.0–100.0)
MPV: 12 fL (ref 7.5–12.5)
Monocytes Relative: 8.6 %
NEUTROS PCT: 48.1 %
Neutro Abs: 2838 cells/uL (ref 1500–7800)
Platelets: 312 10*3/uL (ref 140–400)
RBC: 5.62 10*6/uL — ABNORMAL HIGH (ref 3.80–5.10)
RDW: 13.2 % (ref 11.0–15.0)
Total Lymphocyte: 39.8 %
WBC: 5.9 10*3/uL (ref 3.8–10.8)
WBCMIX: 507 {cells}/uL (ref 200–950)

## 2017-07-24 LAB — COMPLETE METABOLIC PANEL WITH GFR
AG RATIO: 1.4 (calc) (ref 1.0–2.5)
ALT: 18 U/L (ref 6–29)
AST: 20 U/L (ref 10–35)
Albumin: 4.2 g/dL (ref 3.6–5.1)
Alkaline phosphatase (APISO): 104 U/L (ref 33–130)
BILIRUBIN TOTAL: 0.4 mg/dL (ref 0.2–1.2)
BUN: 10 mg/dL (ref 7–25)
CALCIUM: 9.7 mg/dL (ref 8.6–10.4)
CO2: 26 mmol/L (ref 20–32)
Chloride: 103 mmol/L (ref 98–110)
Creat: 0.83 mg/dL (ref 0.50–1.05)
GFR, EST NON AFRICAN AMERICAN: 79 mL/min/{1.73_m2} (ref >=60)
GFR, Est African American: 91 mL/min/{1.73_m2} (ref >=60)
GLOBULIN: 2.9 g/dL (ref 1.9–3.7)
Glucose, Bld: 89 mg/dL (ref 65–99)
POTASSIUM: 4.3 mmol/L (ref 3.5–5.3)
SODIUM: 140 mmol/L (ref 135–146)
Total Protein: 7.1 g/dL (ref 6.1–8.1)

## 2017-07-24 LAB — HIV ANTIBODY (ROUTINE TESTING W REFLEX): HIV: NONREACTIVE

## 2017-07-24 LAB — VITAMIN D 25 HYDROXY (VIT D DEFICIENCY, FRACTURES): Vit D, 25-Hydroxy: 24 ng/mL — ABNORMAL LOW (ref 30–100)

## 2017-07-24 LAB — LIPID PANEL
Cholesterol: 226 mg/dL — ABNORMAL HIGH (ref <200)
HDL: 64 mg/dL (ref >50)
LDL Cholesterol (Calc): 138 mg/dL (calc) — ABNORMAL HIGH
NON-HDL CHOLESTEROL (CALC): 162 mg/dL — AB (ref <130)
Total CHOL/HDL Ratio: 3.5 (calc) (ref <5.0)
Triglycerides: 119 mg/dL (ref <150)

## 2017-07-24 LAB — HEPATITIS C ANTIBODY
HEP C AB: NONREACTIVE
SIGNAL TO CUT-OFF: 0.02 (ref <1.00)

## 2017-07-24 LAB — TSH: TSH: 2.02 m[IU]/L (ref 0.40–4.50)

## 2017-07-25 ENCOUNTER — Other Ambulatory Visit: Payer: Self-pay

## 2017-07-25 MED ORDER — CHOLECALCIFEROL 50 MCG (2000 UT) PO CAPS: 2000.0000 [IU] | ORAL_CAPSULE | Freq: Every day | ORAL | 3 refills | Status: AC

## 2017-08-14 ENCOUNTER — Other Ambulatory Visit: Payer: Self-pay | Admitting: Physician Assistant

## 2017-08-14 DIAGNOSIS — Z1231 Encounter for screening mammogram for malignant neoplasm of breast: Secondary | ICD-10-CM

## 2017-09-12 ENCOUNTER — Ambulatory Visit
Admission: RE | Admit: 2017-09-12 | Discharge: 2017-09-12 | Disposition: A | Discharge status: Home / Self Care | Payer: BLUE CROSS/BLUE SHIELD | Source: Ambulatory Visit | Attending: Physician Assistant | Admitting: Physician Assistant

## 2017-09-12 DIAGNOSIS — Z1231 Encounter for screening mammogram for malignant neoplasm of breast: Secondary | ICD-10-CM

## 2017-09-15 ENCOUNTER — Other Ambulatory Visit: Payer: Self-pay | Admitting: Physician Assistant

## 2017-09-15 DIAGNOSIS — R928 Other abnormal and inconclusive findings on diagnostic imaging of breast: Secondary | ICD-10-CM

## 2017-09-26 ENCOUNTER — Ambulatory Visit
Admission: RE | Admit: 2017-09-26 | Discharge: 2017-09-26 | Disposition: A | Discharge status: Home / Self Care | Payer: BLUE CROSS/BLUE SHIELD | Source: Ambulatory Visit | Attending: Physician Assistant | Admitting: Physician Assistant

## 2017-09-26 DIAGNOSIS — R928 Other abnormal and inconclusive findings on diagnostic imaging of breast: Secondary | ICD-10-CM

## 2018-02-25 ENCOUNTER — Other Ambulatory Visit: Payer: Self-pay | Admitting: Physician Assistant

## 2018-05-27 ENCOUNTER — Other Ambulatory Visit: Payer: Self-pay | Admitting: Occupational Medicine

## 2018-05-27 ENCOUNTER — Ambulatory Visit: Payer: Self-pay

## 2018-05-27 DIAGNOSIS — M79644 Pain in right finger(s): Secondary | ICD-10-CM

## 2018-07-31 ENCOUNTER — Encounter: Payer: BLUE CROSS/BLUE SHIELD | Admitting: Family Medicine

## 2018-07-31 ENCOUNTER — Other Ambulatory Visit: Payer: BLUE CROSS/BLUE SHIELD

## 2018-07-31 DIAGNOSIS — I10 Essential (primary) hypertension: Secondary | ICD-10-CM | POA: Diagnosis not present

## 2018-07-31 DIAGNOSIS — E559 Vitamin D deficiency, unspecified: Secondary | ICD-10-CM | POA: Diagnosis not present

## 2018-07-31 DIAGNOSIS — Z0000 Encounter for general adult medical examination without abnormal findings: Secondary | ICD-10-CM | POA: Diagnosis not present

## 2018-08-01 LAB — COMPREHENSIVE METABOLIC PANEL
AG Ratio: 1.4 (calc) (ref 1.0–2.5)
ALKALINE PHOSPHATASE (APISO): 96 U/L (ref 33–130)
ALT: 25 U/L (ref 6–29)
AST: 24 U/L (ref 10–35)
Albumin: 4 g/dL (ref 3.6–5.1)
BILIRUBIN TOTAL: 0.4 mg/dL (ref 0.2–1.2)
BUN: 15 mg/dL (ref 7–25)
CHLORIDE: 105 mmol/L (ref 98–110)
CO2: 25 mmol/L (ref 20–32)
Calcium: 9.5 mg/dL (ref 8.6–10.4)
Creat: 1.04 mg/dL (ref 0.50–1.05)
GLOBULIN: 2.8 g/dL (ref 1.9–3.7)
Glucose, Bld: 124 mg/dL — ABNORMAL HIGH (ref 65–99)
Potassium: 4 mmol/L (ref 3.5–5.3)
Sodium: 139 mmol/L (ref 135–146)
Total Protein: 6.8 g/dL (ref 6.1–8.1)

## 2018-08-01 LAB — CBC WITH DIFFERENTIAL/PLATELET
BASOS PCT: 1 %
Basophils Absolute: 60 cells/uL (ref 0–200)
EOS ABS: 264 {cells}/uL (ref 15–500)
Eosinophils Relative: 4.4 %
HEMATOCRIT: 43.7 % (ref 35.0–45.0)
Hemoglobin: 14.3 g/dL (ref 11.7–15.5)
Lymphs Abs: 2106 cells/uL (ref 850–3900)
MCH: 27.2 pg (ref 27.0–33.0)
MCHC: 32.7 g/dL (ref 32.0–36.0)
MCV: 83.2 fL (ref 80.0–100.0)
MPV: 11.4 fL (ref 7.5–12.5)
Monocytes Relative: 6.9 %
NEUTROS ABS: 3156 {cells}/uL (ref 1500–7800)
Neutrophils Relative %: 52.6 %
Platelets: 272 10*3/uL (ref 140–400)
RBC: 5.25 10*6/uL — ABNORMAL HIGH (ref 3.80–5.10)
RDW: 13.1 % (ref 11.0–15.0)
Total Lymphocyte: 35.1 %
WBC: 6 10*3/uL (ref 3.8–10.8)
WBCMIX: 414 {cells}/uL (ref 200–950)

## 2018-08-01 LAB — LIPID PANEL
CHOLESTEROL: 212 mg/dL — AB (ref <200)
HDL: 53 mg/dL (ref >50)
LDL Cholesterol (Calc): 143 mg/dL (calc) — ABNORMAL HIGH
NON-HDL CHOLESTEROL (CALC): 159 mg/dL — AB (ref <130)
Total CHOL/HDL Ratio: 4 (calc) (ref <5.0)
Triglycerides: 66 mg/dL (ref <150)

## 2018-08-01 LAB — TSH: TSH: 2.37 mIU/L (ref 0.40–4.50)

## 2018-08-01 LAB — VITAMIN D 25 HYDROXY (VIT D DEFICIENCY, FRACTURES): Vit D, 25-Hydroxy: 44 ng/mL (ref 30–100)

## 2018-08-05 ENCOUNTER — Encounter: Payer: Self-pay | Admitting: Family Medicine

## 2018-08-11 ENCOUNTER — Encounter: Payer: Self-pay | Admitting: Family Medicine

## 2018-08-11 ENCOUNTER — Ambulatory Visit (INDEPENDENT_AMBULATORY_CARE_PROVIDER_SITE_OTHER): Payer: BLUE CROSS/BLUE SHIELD | Admitting: Family Medicine

## 2018-08-11 VITALS — BP 126/80 | HR 68 | Temp 98.1°F | Resp 16 | Ht 66.5 in | Wt 176.4 lb

## 2018-08-11 DIAGNOSIS — I10 Essential (primary) hypertension: Secondary | ICD-10-CM | POA: Diagnosis not present

## 2018-08-11 DIAGNOSIS — Z01419 Encounter for gynecological examination (general) (routine) without abnormal findings: Secondary | ICD-10-CM

## 2018-08-11 DIAGNOSIS — Z124 Encounter for screening for malignant neoplasm of cervix: Secondary | ICD-10-CM | POA: Diagnosis not present

## 2018-08-11 DIAGNOSIS — Z23 Encounter for immunization: Secondary | ICD-10-CM

## 2018-08-11 DIAGNOSIS — Z0000 Encounter for general adult medical examination without abnormal findings: Secondary | ICD-10-CM | POA: Diagnosis not present

## 2018-08-11 DIAGNOSIS — Z1239 Encounter for other screening for malignant neoplasm of breast: Secondary | ICD-10-CM

## 2018-08-11 DIAGNOSIS — E785 Hyperlipidemia, unspecified: Secondary | ICD-10-CM | POA: Insufficient documentation

## 2018-08-11 DIAGNOSIS — R7301 Impaired fasting glucose: Secondary | ICD-10-CM

## 2018-08-11 DIAGNOSIS — E7800 Pure hypercholesterolemia, unspecified: Secondary | ICD-10-CM | POA: Insufficient documentation

## 2018-08-11 DIAGNOSIS — R7303 Prediabetes: Secondary | ICD-10-CM

## 2018-08-11 NOTE — Patient Instructions (Addendum)
I will call you with your A1C - it is a test that shows blood sugar average over three months.  If it is positive we will need to start some medicines and have another office visit to discuss this.  If negative - we can recheck labs in 6 months      Health Maintenance, Female Adopting a healthy lifestyle and getting preventive care can go a long way to promote health and wellness. Talk with your health care provider about what schedule of regular examinations is right for you. This is a good chance for you to check in with your provider about disease prevention and staying healthy. In between checkups, there are plenty of things you can do on your own. Experts have done a lot of research about which lifestyle changes and preventive measures are most likely to keep you healthy. Ask your health care provider for more information. Weight and diet Eat a healthy diet  Be sure to include plenty of vegetables, fruits, low-fat dairy products, and lean protein.  Do not eat a lot of foods high in solid fats, added sugars, or salt.  Get regular exercise. This is one of the most important things you can do for your health. ? Most adults should exercise for at least 150 minutes each week. The exercise should increase your heart rate and make you sweat (moderate-intensity exercise). ? Most adults should also do strengthening exercises at least twice a week. This is in addition to the moderate-intensity exercise.  Maintain a healthy weight  Body mass index (BMI) is a measurement that can be used to identify possible weight problems. It estimates body fat based on height and weight. Your health care provider can help determine your BMI and help you achieve or maintain a healthy weight.  For females 29 years of age and older: ? A BMI below 18.5 is considered underweight. ? A BMI of 18.5 to 24.9 is normal. ? A BMI of 25 to 29.9 is considered overweight. ? A BMI of 30 and above is considered obese.  Watch  levels of cholesterol and blood lipids  You should start having your blood tested for lipids and cholesterol at 57 years of age, then have this test every 5 years.  You may need to have your cholesterol levels checked more often if: ? Your lipid or cholesterol levels are high. ? You are older than 57 years of age. ? You are at high risk for heart disease.  Cancer screening Lung Cancer  Lung cancer screening is recommended for adults 75-25 years old who are at high risk for lung cancer because of a history of smoking.  A yearly low-dose CT scan of the lungs is recommended for people who: ? Currently smoke. ? Have quit within the past 15 years. ? Have at least a 30-pack-year history of smoking. A pack year is smoking an average of one pack of cigarettes a day for 1 year.  Yearly screening should continue until it has been 15 years since you quit.  Yearly screening should stop if you develop a health problem that would prevent you from having lung cancer treatment.  Breast Cancer  Practice breast self-awareness. This means understanding how your breasts normally appear and feel.  It also means doing regular breast self-exams. Let your health care provider know about any changes, no matter how small.  If you are in your 20s or 30s, you should have a clinical breast exam (CBE) by a health care provider every  1-3 years as part of a regular health exam.  If you are 66 or older, have a CBE every year. Also consider having a breast X-ray (mammogram) every year.  If you have a family history of breast cancer, talk to your health care provider about genetic screening.  If you are at high risk for breast cancer, talk to your health care provider about having an MRI and a mammogram every year.  Breast cancer gene (BRCA) assessment is recommended for women who have family members with BRCA-related cancers. BRCA-related cancers include: ? Breast. ? Ovarian. ? Tubal. ? Peritoneal  cancers.  Results of the assessment will determine the need for genetic counseling and BRCA1 and BRCA2 testing.  Cervical Cancer Your health care provider may recommend that you be screened regularly for cancer of the pelvic organs (ovaries, uterus, and vagina). This screening involves a pelvic examination, including checking for microscopic changes to the surface of your cervix (Pap test). You may be encouraged to have this screening done every 3 years, beginning at age 62.  For women ages 30-65, health care providers may recommend pelvic exams and Pap testing every 3 years, or they may recommend the Pap and pelvic exam, combined with testing for human papilloma virus (HPV), every 5 years. Some types of HPV increase your risk of cervical cancer. Testing for HPV may also be done on women of any age with unclear Pap test results.  Other health care providers may not recommend any screening for nonpregnant women who are considered low risk for pelvic cancer and who do not have symptoms. Ask your health care provider if a screening pelvic exam is right for you.  If you have had past treatment for cervical cancer or a condition that could lead to cancer, you need Pap tests and screening for cancer for at least 20 years after your treatment. If Pap tests have been discontinued, your risk factors (such as having a new sexual partner) need to be reassessed to determine if screening should resume. Some women have medical problems that increase the chance of getting cervical cancer. In these cases, your health care provider may recommend more frequent screening and Pap tests.  Colorectal Cancer  This type of cancer can be detected and often prevented.  Routine colorectal cancer screening usually begins at 57 years of age and continues through 57 years of age.  Your health care provider may recommend screening at an earlier age if you have risk factors for colon cancer.  Your health care provider may also  recommend using home test kits to check for hidden blood in the stool.  A small camera at the end of a tube can be used to examine your colon directly (sigmoidoscopy or colonoscopy). This is done to check for the earliest forms of colorectal cancer.  Routine screening usually begins at age 6.  Direct examination of the colon should be repeated every 5-10 years through 57 years of age. However, you may need to be screened more often if early forms of precancerous polyps or small growths are found.  Skin Cancer  Check your skin from head to toe regularly.  Tell your health care provider about any new moles or changes in moles, especially if there is a change in a mole's shape or color.  Also tell your health care provider if you have a mole that is larger than the size of a pencil eraser.  Always use sunscreen. Apply sunscreen liberally and repeatedly throughout the day.  Protect  yourself by wearing long sleeves, pants, a wide-brimmed hat, and sunglasses whenever you are outside.  Heart disease, diabetes, and high blood pressure  High blood pressure causes heart disease and increases the risk of stroke. High blood pressure is more likely to develop in: ? People who have blood pressure in the high end of the normal range (130-139/85-89 mm Hg). ? People who are overweight or obese. ? People who are African American.  If you are 51-43 years of age, have your blood pressure checked every 3-5 years. If you are 60 years of age or older, have your blood pressure checked every year. You should have your blood pressure measured twice-once when you are at a hospital or clinic, and once when you are not at a hospital or clinic. Record the average of the two measurements. To check your blood pressure when you are not at a hospital or clinic, you can use: ? An automated blood pressure machine at a pharmacy. ? A home blood pressure monitor.  If you are between 85 years and 59 years old, ask your  health care provider if you should take aspirin to prevent strokes.  Have regular diabetes screenings. This involves taking a blood sample to check your fasting blood sugar level. ? If you are at a normal weight and have a low risk for diabetes, have this test once every three years after 57 years of age. ? If you are overweight and have a high risk for diabetes, consider being tested at a younger age or more often. Preventing infection Hepatitis B  If you have a higher risk for hepatitis B, you should be screened for this virus. You are considered at high risk for hepatitis B if: ? You were born in a country where hepatitis B is common. Ask your health care provider which countries are considered high risk. ? Your parents were born in a high-risk country, and you have not been immunized against hepatitis B (hepatitis B vaccine). ? You have HIV or AIDS. ? You use needles to inject street drugs. ? You live with someone who has hepatitis B. ? You have had sex with someone who has hepatitis B. ? You get hemodialysis treatment. ? You take certain medicines for conditions, including cancer, organ transplantation, and autoimmune conditions.  Hepatitis C  Blood testing is recommended for: ? Everyone born from [birth year] through 1965. ? Anyone with known risk factors for hepatitis C.  Sexually transmitted infections (STIs)  You should be screened for sexually transmitted infections (STIs) including gonorrhea and chlamydia if: ? You are sexually active and are younger than 57 years of age. ? You are older than 57 years of age and your health care provider tells you that you are at risk for this type of infection. ? Your sexual activity has changed since you were last screened and you are at an increased risk for chlamydia or gonorrhea. Ask your health care provider if you are at risk.  If you do not have HIV, but are at risk, it may be recommended that you take a prescription medicine daily to  prevent HIV infection. This is called pre-exposure prophylaxis (PrEP). You are considered at risk if: ? You are sexually active and do not regularly use condoms or know the HIV status of your partner(s). ? You take drugs by injection. ? You are sexually active with a partner who has HIV.  Talk with your health care provider about whether you are at high risk of being  infected with HIV. If you choose to begin PrEP, you should first be tested for HIV. You should then be tested every 3 months for as long as you are taking PrEP. Pregnancy  If you are premenopausal and you may become pregnant, ask your health care provider about preconception counseling.  If you may become pregnant, take 400 to 800 micrograms (mcg) of folic acid every day.  If you want to prevent pregnancy, talk to your health care provider about birth control (contraception). Osteoporosis and menopause  Osteoporosis is a disease in which the bones lose minerals and strength with aging. This can result in serious bone fractures. Your risk for osteoporosis can be identified using a bone density scan.  If you are 54 years of age or older, or if you are at risk for osteoporosis and fractures, ask your health care provider if you should be screened.  Ask your health care provider whether you should take a calcium or vitamin D supplement to lower your risk for osteoporosis.  Menopause may have certain physical symptoms and risks.  Hormone replacement therapy may reduce some of these symptoms and risks. Talk to your health care provider about whether hormone replacement therapy is right for you. Follow these instructions at home:  Schedule regular health, dental, and eye exams.  Stay current with your immunizations.  Do not use any tobacco products including cigarettes, chewing tobacco, or electronic cigarettes.  If you are pregnant, do not drink alcohol.  If you are breastfeeding, limit how much and how often you drink  alcohol.  Limit alcohol intake to no more than 1 drink per day for nonpregnant women. One drink equals 12 ounces of beer, 5 ounces of wine, or 1 ounces of hard liquor.  Do not use street drugs.  Do not share needles.  Ask your health care provider for help if you need support or information about quitting drugs.  Tell your health care provider if you often feel depressed.  Tell your health care provider if you have ever been abused or do not feel safe at home. This information is not intended to replace advice given to you by your health care provider. Make sure you discuss any questions you have with your health care provider. Document Released: 03/11/2011 Document Revised: 02/01/2016 Document Reviewed: 05/30/2015 Elsevier Interactive Patient Education  [employer].

## 2018-08-11 NOTE — Assessment & Plan Note (Signed)
Compliant to meds, no SE BP at goal Recent labs reviewed, normal renal function Follow up in 6 months

## 2018-08-11 NOTE — Assessment & Plan Note (Signed)
Elevated LDL with recent labs Reviewed with pt today No current diet, no past tx Tx with diet and exercise, AHA cholesterol handout given and exercise reviewed Per ASCVD calculator low risk - however A1C pending  The 10-year ASCVD risk score Mikey Bussing DC Jr., et al., 2013) is: 5.8%   Values used to calculate the score:     Age: 57 years     Sex: Female     Is Non-Hispanic African American: Yes     Diabetic: No     Tobacco smoker: No     Systolic Blood Pressure: 403 mmHg     Is BP treated: Yes     HDL Cholesterol: 53 mg/dL     Total Cholesterol: 212 mg/dL

## 2018-08-11 NOTE — Progress Notes (Signed)
Patient: Amber Boone, Female    DOB: [birth year], 57 y.o.   MRN: [id number] Visit Date: 08/11/2018  Today's Provider: Delsa Grana, PA-C   Chief Complaint  Patient presents with  . Annual Exam    Patient states no concerns this visit   Subjective:    Annual physical exam Amber Boone is a 57 y.o. female who presents today for health maintenance and complete physical. She feels well. She reports exercising not very much, she is very busy and active with family and grandchildren. She reports she is sleeping well.  ----------------------------------------------------------------- HTN - pt states excellent compliance to meds, no SE, no high or low BP, not monitoring BP, No CP, SOB, LE edema, near syncope, orthopnea, palpitations, DOE  Labs done 1.5 weeks ago  No vaginal or urinary sx  Pt states due for PAP, denies any surgeries except having tubes tied  Review of Systems  Constitutional: Negative.  Negative for activity change, appetite change, chills, diaphoresis, fatigue and unexpected weight change.  HENT: Negative.   Eyes: Negative.   Respiratory: Negative.  Negative for chest tightness and shortness of breath.   Cardiovascular: Negative.  Negative for chest pain, palpitations and leg swelling.  Gastrointestinal: Negative.  Negative for abdominal pain and blood in stool.  Endocrine: Negative.  Negative for polydipsia, polyphagia and polyuria.  Genitourinary: Negative.   Musculoskeletal: Negative.  Negative for arthralgias, gait problem, joint swelling and myalgias.  Skin: Negative.  Negative for color change, pallor and rash.  Allergic/Immunologic: Negative.   Neurological: Negative.  Negative for dizziness, syncope, weakness and headaches.  Hematological: Negative.   Psychiatric/Behavioral: Negative.  Negative for confusion, dysphoric mood, self-injury and suicidal ideas. The patient is not nervous/anxious.   All other systems reviewed and are negative.   Social  History      She  reports that she has never smoked. She has never used smokeless tobacco. She reports that she does not drink alcohol or use drugs.       Social History   Socioeconomic History  . Marital status: Single    Spouse name: Not on file  . Number of children: Not on file  . Years of education: Not on file  . Highest education level: Not on file  Occupational History  . Not on file  Social Needs  . Financial resource strain: Not on file  . Food insecurity:    Worry: Not on file    Inability: Not on file  . Transportation needs:    Medical: Not on file    Non-medical: Not on file  Tobacco Use  . Smoking status: Never Smoker  . Smokeless tobacco: Never Used  Substance and Sexual Activity  . Alcohol use: No  . Drug use: No  . Sexual activity: Never    Birth control/protection: Surgical  Lifestyle  . Physical activity:    Days per week: Not on file    Minutes per session: Not on file  . Stress: Not on file  Relationships  . Social connections:    Talks on phone: Not on file    Gets together: Not on file    Attends religious service: Not on file    Active member of club or organization: Not on file    Attends meetings of clubs or organizations: Not on file    Relationship status: Not on file  Other Topics Concern  . 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   3 children: Ages 58,30, 64 --all out of house    Past Medical History:  Diagnosis Date  . Hypertension      Patient Active Problem List   Diagnosis Date Noted  . Pure hypercholesterolemia 08/11/2018  . Vitamin D deficiency 07/23/2016  . Atrophic vaginitis 07/18/2016  . Allergic rhinitis 01/20/2014  . Hypertension     Past Surgical History:  Procedure Laterality Date  . BREAST CYST ASPIRATION Right 2014  . TOTAL ABDOMINAL HYSTERECTOMY     Secondary to fibroids-No cancer  . TUBAL  LIGATION      Family History        Family Status  Relation Name Status  . Father  Deceased  . Mother  Alive  . Sister  Alive  . Brother  Alive  . MGM  Deceased  . MGF  Deceased  . PGM  Deceased  . PGF  Deceased  . Brother  Alive  . Sister  Alive  . Neg Hx  (Not Specified)        Her family history includes Cancer in her father. There is no history of Colon cancer.      Allergies  Allergen Reactions  . Benazepril     Cough      Current Outpatient Medications:  .  amLODipine (NORVASC) 5 MG tablet, Take 1 tablet (5 mg total) daily by mouth., Disp: 90 tablet, Rfl: 3 .  cetirizine (ZYRTEC) 10 MG tablet, TAKE 1 TABLET BY MOUTH EVERY DAY, Disp: 90 tablet, Rfl: 1 .  Cholecalciferol 2000 units CAPS, Take 1 capsule (2,000 Units total) daily by mouth., Disp: 30 each, Rfl: 3 .  estradiol (ESTRACE VAGINAL) 0.1 MG/GM vaginal cream, Place 1 Applicatorful at bedtime vaginally., Disp: 42.5 g, Rfl: 12 .  hydrochlorothiazide (MICROZIDE) 12.5 MG capsule, Take 1 capsule (12.5 mg total) daily by mouth., Disp: 90 capsule, Rfl: 3 .  Multiple Vitamins-Minerals (MULTIVITAMIN PO), Take by mouth., Disp: , Rfl:    Patient Care Team: Rennis Golden as PCP - General (Physician Assistant)      Objective:   Vitals: BP 126/80   Pulse 68   Temp 98.1 F (36.7 C) (Oral)   Resp 16   Ht 5' 6.5" (1.689 m)   Wt 176 lb 6 oz (80 kg)   SpO2 99%   BMI 28.04 kg/m    Vitals:   08/11/18 1204  BP: 126/80  Pulse: 68  Resp: 16  Temp: 98.1 F (36.7 C)  TempSrc: Oral  SpO2: 99%  Weight: 176 lb 6 oz (80 kg)  Height: 5' 6.5" (1.689 m)     Physical Exam  Constitutional: She is oriented to person, place, and time. She appears well-developed and well-nourished.  Non-toxic appearance. No distress.  HENT:  Head: Normocephalic and atraumatic.  Right Ear: External ear normal.  Left Ear: External ear normal.  Nose: Nose normal.  Mouth/Throat: Uvula is midline, oropharynx is clear and moist and  mucous membranes are normal. No oropharyngeal exudate.  Eyes: Pupils are equal, round, and reactive to light. Conjunctivae, EOM and lids are normal. No scleral icterus.  Neck: Normal range of motion and phonation normal. Neck supple. No tracheal deviation present.  Cardiovascular: Normal rate, regular rhythm, normal heart sounds and normal pulses. Exam reveals no gallop and no friction rub.  No murmur heard. Pulses:      Radial pulses are 2+ on the right side, and 2+ on the left side.  Posterior tibial pulses are 2+ on the right side, and 2+ on the left side.  Pulmonary/Chest: Effort normal and breath sounds normal. No stridor. No respiratory distress. She has no wheezes. She has no rhonchi. She has no rales. She exhibits no tenderness.  Abdominal: Soft. Normal appearance and bowel sounds are normal. She exhibits no distension. There is no tenderness. There is no rebound and no guarding.  Genitourinary: There is no rash, tenderness, lesion or injury on the right labia. There is no rash, tenderness, lesion or injury on the left labia. Right adnexum displays no mass, no tenderness and no fullness. Left adnexum displays no mass, no tenderness and no fullness. No erythema, tenderness or bleeding in the vagina. No foreign body in the vagina. No signs of injury around the vagina. Vaginal discharge found.  Genitourinary Comments: Loss of tone to vaginal vault  Musculoskeletal: Normal range of motion. She exhibits no edema or deformity.  Lymphadenopathy:    She has no cervical adenopathy.  Neurological: She is alert and oriented to person, place, and time. She exhibits normal muscle tone. Coordination and gait normal.  Skin: Skin is warm, dry and intact. Capillary refill takes less than 2 seconds. No rash noted. She is not diaphoretic. No pallor.  Psychiatric: She has a normal mood and affect. Her speech is normal and behavior is normal. Judgment and thought content normal.  Nursing note and vitals  reviewed.    Depression Screen PHQ 2/9 Scores 08/11/2018 07/23/2017 07/18/2016 07/18/2016  PHQ - 2 Score 0 0 0 0  PHQ- 9 Score - 0 0 -     Office Visit from 08/11/2018 in [facility]  AUDIT-C Score  0         Assessment & Plan:     Routine Health Maintenance and Physical Exam  Exercise Activities and Dietary recommendations Goals - diet and exercise for cholesterol    Discussed health benefits of physical activity, and encouraged her to engage in regular exercise appropriate for her age and condition.   Immunization History  Administered Date(s) Administered  . Influenza,inj,Quad PF,6+ Mos 06/10/2013, 06/13/2014, 07/12/2015, 07/18/2016, 07/23/2017, 08/11/2018  . Tdap 06/10/2013    Health Maintenance  Topic Date Due  . PAP SMEAR  06/14/2039 (Originally 12/11/1981)  . MAMMOGRAM  09/13/2019  . TETANUS/TDAP  06/11/2023  . COLONOSCOPY  07/02/2023  . INFLUENZA VACCINE  Completed  . Hepatitis C Screening  Completed  . HIV Screening  Completed    Flu done Mammogram ordered PAP done - some evidence of pelvic floor and vaginal vault relaxation, no Urinary sx, pt did not want GYN referral  Labs reviewed - Cholesterol high - discussed diet and exercise Fasting glucose elevated A1C done today - will determine f/up time.  Problem List Items Addressed This Visit      Cardiovascular and Mediastinum   Hypertension    Compliant to meds, no SE BP at goal Recent labs reviewed, normal renal function Follow up in 6 months        Other   Pure hypercholesterolemia    Elevated LDL with recent labs Reviewed with pt today No current diet, no past tx Tx with diet and exercise, AHA cholesterol handout given and exercise reviewed Per ASCVD calculator low risk - however A1C pending  The 10-year ASCVD risk score Mikey Bussing DC Jr., et al., 2013) is: 5.8%   Values used to calculate the score:     Age: 56 years     Sex: Female  Is Non-Hispanic African American: Yes      Diabetic: No     Tobacco smoker: No     Systolic Blood Pressure: 268 mmHg     Is BP treated: Yes     HDL Cholesterol: 53 mg/dL     Total Cholesterol: 212 mg/dL        Other Visit Diagnoses    Routine general medical examination at a health care facility    -  Primary   Elevated fasting blood sugar       add A1C - if prediabetes f/up 6 months, if + DM follow up OV   Relevant Orders   Hemoglobin A1c   Screening for malignant neoplasm of breast       Relevant Orders   MM 3D SCREEN BREAST BILATERAL   Need for influenza vaccination       Relevant Orders   Flu Vaccine QUAD 6+ mos PF IM (Fluarix Quad PF) (Completed)   Encounter for gynecological examination without abnormal finding           Delsa Grana, PA-C 08/11/18 1:09 PM  [facility]

## 2018-08-12 ENCOUNTER — Encounter: Payer: Self-pay | Admitting: Family Medicine

## 2018-08-12 LAB — HEMOGLOBIN A1C
HEMOGLOBIN A1C: 6.3 %{Hb} — AB (ref <5.7)
Mean Plasma Glucose: 134 (calc)
eAG (mmol/L): 7.4 (calc)

## 2018-08-12 LAB — PAP, TP IMAGING W/ HPV RNA, RFLX HPV TYPE 16,18/45: HPV DNA HIGH RISK: NOT DETECTED

## 2018-08-12 NOTE — Addendum Note (Signed)
Addended by: Delsa Grana on: 08/12/2018 10:06 AM   Modules accepted: Orders

## 2018-08-26 ENCOUNTER — Other Ambulatory Visit: Payer: Self-pay

## 2018-08-26 MED ORDER — CETIRIZINE HCL 10 MG PO TABS: 10.0000 mg | ORAL_TABLET | Freq: Every day | ORAL | 1 refills | Status: DC

## 2018-08-26 MED ORDER — HYDROCHLOROTHIAZIDE 12.5 MG PO CAPS: 12.5000 mg | ORAL_CAPSULE | Freq: Every day | ORAL | 1 refills | Status: DC

## 2018-08-31 ENCOUNTER — Other Ambulatory Visit: Payer: Self-pay | Admitting: Family Medicine

## 2018-08-31 ENCOUNTER — Other Ambulatory Visit: Payer: Self-pay

## 2018-08-31 DIAGNOSIS — Z1231 Encounter for screening mammogram for malignant neoplasm of breast: Secondary | ICD-10-CM

## 2018-08-31 MED ORDER — AMLODIPINE BESYLATE 5 MG PO TABS: 5.0000 mg | ORAL_TABLET | Freq: Every day | ORAL | 0 refills | Status: DC

## 2018-10-01 ENCOUNTER — Ambulatory Visit
Admission: RE | Admit: 2018-10-01 | Discharge: 2018-10-01 | Disposition: A | Discharge status: Home / Self Care | Payer: BLUE CROSS/BLUE SHIELD | Source: Ambulatory Visit | Attending: Family Medicine | Admitting: Family Medicine

## 2018-10-01 DIAGNOSIS — Z1231 Encounter for screening mammogram for malignant neoplasm of breast: Secondary | ICD-10-CM | POA: Diagnosis not present

## 2018-10-02 ENCOUNTER — Encounter: Payer: Self-pay | Admitting: Family Medicine

## 2018-10-02 DIAGNOSIS — R7303 Prediabetes: Secondary | ICD-10-CM | POA: Insufficient documentation

## 2018-10-02 DIAGNOSIS — E119 Type 2 diabetes mellitus without complications: Secondary | ICD-10-CM | POA: Insufficient documentation

## 2018-10-27 LAB — HM DIABETES EYE EXAM

## 2018-10-29 ENCOUNTER — Encounter: Payer: Self-pay | Admitting: *Deleted

## 2018-12-29 ENCOUNTER — Other Ambulatory Visit: Payer: Self-pay | Admitting: Family Medicine

## 2018-12-30 ENCOUNTER — Other Ambulatory Visit: Payer: Self-pay | Admitting: Family Medicine

## 2019-03-18 ENCOUNTER — Other Ambulatory Visit: Payer: Self-pay | Admitting: Family Medicine

## 2019-03-18 NOTE — Telephone Encounter (Signed)
Medication filled x1 with no refills.   Requires office visit before any further refills can be given.  

## 2019-06-14 ENCOUNTER — Other Ambulatory Visit: Payer: Self-pay | Admitting: Family Medicine

## 2019-06-15 ENCOUNTER — Other Ambulatory Visit: Payer: Self-pay | Admitting: Family Medicine

## 2019-08-30 ENCOUNTER — Other Ambulatory Visit: Payer: Self-pay

## 2019-08-30 ENCOUNTER — Ambulatory Visit: Payer: BC Managed Care – PPO | Attending: Internal Medicine

## 2019-08-30 DIAGNOSIS — Z20828 Contact with and (suspected) exposure to other viral communicable diseases: Secondary | ICD-10-CM | POA: Insufficient documentation

## 2019-08-30 DIAGNOSIS — Z20822 Contact with and (suspected) exposure to covid-19: Secondary | ICD-10-CM

## 2019-08-31 ENCOUNTER — Other Ambulatory Visit: Payer: Self-pay | Admitting: Family Medicine

## 2019-08-31 ENCOUNTER — Telehealth: Payer: Self-pay | Admitting: *Deleted

## 2019-08-31 DIAGNOSIS — Z1231 Encounter for screening mammogram for malignant neoplasm of breast: Secondary | ICD-10-CM

## 2019-08-31 LAB — NOVEL CORONAVIRUS, NAA: SARS-CoV-2, NAA: NOT DETECTED

## 2019-08-31 NOTE — Telephone Encounter (Signed)
Pt called for Covid results, no indication of test done, pending or resulted.  NT called [employer] [facility], states active.

## 2019-09-01 ENCOUNTER — Telehealth: Payer: Self-pay

## 2019-09-01 NOTE — Telephone Encounter (Signed)
Caller advise result not back yet 

## 2019-09-02 ENCOUNTER — Telehealth: Payer: Self-pay

## 2019-09-02 NOTE — Telephone Encounter (Signed)
Pt. Given COVID 19 results, verbalizes understanding. 

## 2019-09-10 HISTORY — PX: BREAST EXCISIONAL BIOPSY: SUR124

## 2019-09-18 ENCOUNTER — Other Ambulatory Visit: Payer: Self-pay | Admitting: Family Medicine

## 2019-09-20 ENCOUNTER — Other Ambulatory Visit: Payer: Self-pay | Admitting: Family Medicine

## 2019-10-20 ENCOUNTER — Other Ambulatory Visit: Payer: Self-pay

## 2019-10-20 ENCOUNTER — Ambulatory Visit
Admission: RE | Admit: 2019-10-20 | Discharge: 2019-10-20 | Disposition: A | Discharge status: Home / Self Care | Payer: BLUE CROSS/BLUE SHIELD | Source: Ambulatory Visit | Attending: Family Medicine | Admitting: Family Medicine

## 2019-10-20 DIAGNOSIS — Z1231 Encounter for screening mammogram for malignant neoplasm of breast: Secondary | ICD-10-CM

## 2019-10-25 ENCOUNTER — Other Ambulatory Visit: Payer: Self-pay | Admitting: Family Medicine

## 2019-10-25 DIAGNOSIS — R928 Other abnormal and inconclusive findings on diagnostic imaging of breast: Secondary | ICD-10-CM

## 2019-11-08 ENCOUNTER — Other Ambulatory Visit: Payer: Self-pay | Admitting: Family Medicine

## 2019-11-08 ENCOUNTER — Other Ambulatory Visit: Payer: Self-pay

## 2019-11-08 ENCOUNTER — Ambulatory Visit
Admission: RE | Admit: 2019-11-08 | Discharge: 2019-11-08 | Disposition: A | Discharge status: Home / Self Care | Payer: BLUE CROSS/BLUE SHIELD | Source: Ambulatory Visit | Attending: Family Medicine | Admitting: Family Medicine

## 2019-11-08 ENCOUNTER — Ambulatory Visit
Admission: RE | Admit: 2019-11-08 | Discharge: 2019-11-08 | Disposition: A | Discharge status: Home / Self Care | Payer: BC Managed Care – PPO | Source: Ambulatory Visit | Attending: Family Medicine | Admitting: Family Medicine

## 2019-11-08 DIAGNOSIS — R928 Other abnormal and inconclusive findings on diagnostic imaging of breast: Secondary | ICD-10-CM

## 2019-11-08 DIAGNOSIS — N6320 Unspecified lump in the left breast, unspecified quadrant: Secondary | ICD-10-CM

## 2019-11-08 DIAGNOSIS — N6012 Diffuse cystic mastopathy of left breast: Secondary | ICD-10-CM | POA: Diagnosis not present

## 2019-11-08 DIAGNOSIS — N6321 Unspecified lump in the left breast, upper outer quadrant: Secondary | ICD-10-CM | POA: Diagnosis not present

## 2019-11-19 ENCOUNTER — Other Ambulatory Visit: Payer: Self-pay

## 2019-11-19 ENCOUNTER — Ambulatory Visit
Admission: RE | Admit: 2019-11-19 | Discharge: 2019-11-19 | Disposition: A | Discharge status: Home / Self Care | Payer: BC Managed Care – PPO | Source: Ambulatory Visit | Attending: Family Medicine | Admitting: Family Medicine

## 2019-11-19 DIAGNOSIS — N6092 Unspecified benign mammary dysplasia of left breast: Secondary | ICD-10-CM | POA: Diagnosis not present

## 2019-11-19 DIAGNOSIS — N6320 Unspecified lump in the left breast, unspecified quadrant: Secondary | ICD-10-CM

## 2019-11-19 DIAGNOSIS — N6321 Unspecified lump in the left breast, upper outer quadrant: Secondary | ICD-10-CM | POA: Diagnosis not present

## 2019-11-29 ENCOUNTER — Ambulatory Visit (INDEPENDENT_AMBULATORY_CARE_PROVIDER_SITE_OTHER): Payer: BC Managed Care – PPO | Admitting: Family Medicine

## 2019-11-29 ENCOUNTER — Encounter: Payer: Self-pay | Admitting: Family Medicine

## 2019-11-29 ENCOUNTER — Other Ambulatory Visit: Payer: Self-pay

## 2019-11-29 VITALS — BP 138/86 | HR 70 | Temp 98.7°F | Resp 16 | Ht 67.0 in | Wt 180.0 lb

## 2019-11-29 DIAGNOSIS — I10 Essential (primary) hypertension: Secondary | ICD-10-CM | POA: Diagnosis not present

## 2019-11-29 DIAGNOSIS — R7303 Prediabetes: Secondary | ICD-10-CM | POA: Diagnosis not present

## 2019-11-29 DIAGNOSIS — N952 Postmenopausal atrophic vaginitis: Secondary | ICD-10-CM

## 2019-11-29 DIAGNOSIS — Z0000 Encounter for general adult medical examination without abnormal findings: Secondary | ICD-10-CM

## 2019-11-29 DIAGNOSIS — E7800 Pure hypercholesterolemia, unspecified: Secondary | ICD-10-CM | POA: Diagnosis not present

## 2019-11-29 MED ORDER — AMLODIPINE BESYLATE 5 MG PO TABS: 5.0000 mg | ORAL_TABLET | Freq: Every day | ORAL | 1 refills | Status: DC

## 2019-11-29 MED ORDER — HYDROCHLOROTHIAZIDE 12.5 MG PO CAPS: ORAL_CAPSULE | ORAL | 1 refills | Status: DC

## 2019-11-29 MED ORDER — ESTRADIOL 0.1 MG/GM VA CREA: 1.0000 | TOPICAL_CREAM | Freq: Every day | VAGINAL | 12 refills | Status: DC

## 2019-11-29 NOTE — Progress Notes (Signed)
   Subjective:    Patient ID: Amber Boone, female    DOB: Sep 17, 1960, 59 y.o.   MRN: [id number]  Patient presents for Annual Exam (is not fasting)  Patient here for complete physical exam.  Medications reviewed. History reviewed   Hypertension she is taking amlodipine 5 mg once a day along with HCTZ 12.5 mg daily  She does check BP at home some, 140/67      Atrophic vaginitis she has estradiol cream status post hysterectomy  Hyperlipidemia and prediabetes noted in chart.  Last A1c in December 29 was 6.3%.  Last lipid LDL was 143 . She does drink 1 pepsi   Mammogram she had abnormal mammogram with 2 masses noted located in the left breast.  Pathology revealed focal atypical ductal hyperplasia of and Harmtoma.  Was recommended that she have this removed.  She is set to see breast surgeon on April 5.  Immunizations tetanus up-to-date, flu shot UTD,   shingles vaccine Due   Colonoscopy up-to-date 2014  Eye doctor - [facility]  Due for Dentist  She is not exercising currently, she was walking until she had to stop to help care for a family member   Review Of Systems:  GEN- denies fatigue, fever, weight loss,weakness, recent illness HEENT- denies eye drainage, change in vision, nasal discharge, CVS- denies chest pain, palpitations RESP- denies SOB, cough, wheeze ABD- denies N/V, change in stools, abd pain GU- denies dysuria, hematuria, dribbling, incontinence MSK- denies joint pain, muscle aches, injury Neuro- denies headache, dizziness, syncope, seizure activity       Objective:    BP 138/86   Pulse 70   Temp 98.7 F (37.1 C) (Temporal)   Resp 16   Ht 5\' 7"  (1.702 m)   Wt 180 lb (81.6 kg)   SpO2 98%   BMI 28.19 kg/m  GEN- NAD, alert and oriented x3 HEENT- PERRL, EOMI, non injected sclera, pink conjunctiva, MMM, oropharynx clear Neck- Supple, no thyromegaly CVS- RRR, no murmur RESP-CTAB ABD-NABS,soft,NT,ND Psych- normal affect and mood  EXT- No  edema Pulses- Radial, DP- 2+  Fall/depression/CAGE screen negative       Assessment & Plan:      Problem List Items Addressed This Visit      Unprioritized   Atrophic vaginitis    Estrace topically       Relevant Medications   estradiol (ESTRACE VAGINAL) 0.1 MG/GM vaginal cream   Hypertension    BP looks okay, no change tomeds Continue to monitor at home       Relevant Medications   amLODipine (NORVASC) 5 MG tablet   hydrochlorothiazide (MICROZIDE) 12.5 MG capsule   Other Relevant Orders   CBC with Differential/Platelet   Comprehensive metabolic panel   Prediabetes   Relevant Orders   Hemoglobin A1c   Pure hypercholesterolemia    Recheck labs      Relevant Medications   amLODipine (NORVASC) 5 MG tablet   hydrochlorothiazide (MICROZIDE) 12.5 MG capsule   Other Relevant Orders   Lipid panel    Other Visit Diagnoses    Routine general medical examination at a health care facility    -  Primary   CPE done, fasting labs obtained, shingles vaccine to pharmacy, f/u with breast surgeon, discussed diet, exercise      Note: This dictation was prepared with Dragon dictation along with smaller phrase technology. Any transcriptional errors that result from this process are unintentional.

## 2019-11-29 NOTE — Patient Instructions (Addendum)
Shingles Vaccine sent to pharmacy  We will call with lab results  F/U 6 months  Get Cortisone 10 ( 1%)

## 2019-11-29 NOTE — Assessment & Plan Note (Signed)
Recheck labs 

## 2019-11-29 NOTE — Assessment & Plan Note (Signed)
Estrace topically

## 2019-11-29 NOTE — Assessment & Plan Note (Signed)
BP looks okay, no change tomeds Continue to monitor at home

## 2019-11-30 LAB — LIPID PANEL
Cholesterol: 211 mg/dL — ABNORMAL HIGH (ref <200)
HDL: 54 mg/dL (ref >=50)
LDL Cholesterol (Calc): 130 mg/dL (calc) — ABNORMAL HIGH
Non-HDL Cholesterol (Calc): 157 mg/dL (calc) — ABNORMAL HIGH (ref <130)
Total CHOL/HDL Ratio: 3.9 (calc) (ref <5.0)
Triglycerides: 155 mg/dL — ABNORMAL HIGH (ref <150)

## 2019-11-30 LAB — CBC WITH DIFFERENTIAL/PLATELET
Absolute Monocytes: 628 cells/uL (ref 200–950)
Basophils Absolute: 37 cells/uL (ref 0–200)
Basophils Relative: 0.5 %
Eosinophils Absolute: 102 cells/uL (ref 15–500)
Eosinophils Relative: 1.4 %
HCT: 44.8 % (ref 35.0–45.0)
Hemoglobin: 14.5 g/dL (ref 11.7–15.5)
Lymphs Abs: 2358 cells/uL (ref 850–3900)
MCH: 26.7 pg — ABNORMAL LOW (ref 27.0–33.0)
MCHC: 32.4 g/dL (ref 32.0–36.0)
MCV: 82.5 fL (ref 80.0–100.0)
MPV: 11.4 fL (ref 7.5–12.5)
Monocytes Relative: 8.6 %
Neutro Abs: 4176 cells/uL (ref 1500–7800)
Neutrophils Relative %: 57.2 %
Platelets: 306 10*3/uL (ref 140–400)
RBC: 5.43 10*6/uL — ABNORMAL HIGH (ref 3.80–5.10)
RDW: 13.7 % (ref 11.0–15.0)
Total Lymphocyte: 32.3 %
WBC: 7.3 10*3/uL (ref 3.8–10.8)

## 2019-11-30 LAB — COMPREHENSIVE METABOLIC PANEL
AG Ratio: 1.3 (calc) (ref 1.0–2.5)
ALT: 18 U/L (ref 6–29)
AST: 21 U/L (ref 10–35)
Albumin: 3.9 g/dL (ref 3.6–5.1)
Alkaline phosphatase (APISO): 110 U/L (ref 37–153)
BUN: 14 mg/dL (ref 7–25)
CO2: 31 mmol/L (ref 20–32)
Calcium: 10 mg/dL (ref 8.6–10.4)
Chloride: 104 mmol/L (ref 98–110)
Creat: 0.95 mg/dL (ref 0.50–1.05)
Globulin: 3.1 g/dL (calc) (ref 1.9–3.7)
Glucose, Bld: 102 mg/dL — ABNORMAL HIGH (ref 65–99)
Potassium: 3.9 mmol/L (ref 3.5–5.3)
Sodium: 142 mmol/L (ref 135–146)
Total Bilirubin: 0.3 mg/dL (ref 0.2–1.2)
Total Protein: 7 g/dL (ref 6.1–8.1)

## 2019-11-30 LAB — HEMOGLOBIN A1C
Hgb A1c MFr Bld: 6.9 % of total Hgb — ABNORMAL HIGH (ref <5.7)
Mean Plasma Glucose: 151 (calc)
eAG (mmol/L): 8.4 (calc)

## 2019-12-02 ENCOUNTER — Other Ambulatory Visit: Payer: Self-pay | Admitting: *Deleted

## 2019-12-02 MED ORDER — ATORVASTATIN CALCIUM 10 MG PO TABS: 10.0000 mg | ORAL_TABLET | Freq: Every day | ORAL | 3 refills | Status: DC

## 2019-12-02 MED ORDER — METFORMIN HCL 500 MG PO TABS: 500.0000 mg | ORAL_TABLET | Freq: Every day | ORAL | 3 refills | Status: DC

## 2019-12-02 MED ORDER — BLOOD GLUCOSE TEST VI STRP: ORAL_STRIP | 1 refills | Status: DC

## 2019-12-02 MED ORDER — LANCETS MISC: 1 refills | Status: DC

## 2019-12-02 MED ORDER — BLOOD GLUCOSE SYSTEM PAK KIT: PACK | 1 refills | Status: DC

## 2019-12-06 ENCOUNTER — Other Ambulatory Visit: Payer: Self-pay | Admitting: *Deleted

## 2019-12-06 MED ORDER — CETIRIZINE HCL 10 MG PO TABS: 10.0000 mg | ORAL_TABLET | Freq: Every day | ORAL | 0 refills | Status: DC

## 2019-12-13 ENCOUNTER — Ambulatory Visit: Payer: Self-pay | Admitting: Surgery

## 2019-12-13 DIAGNOSIS — N6320 Unspecified lump in the left breast, unspecified quadrant: Secondary | ICD-10-CM

## 2019-12-13 NOTE — H&P (Signed)
Amber Boone Documented: 12/13/2019 11:30 AM Location: [facility] Patient #: [id number] DOB: 12-May-1961 Unknown / Language: Cleophus Molt / Race: Black or African American Female  History of Present Illness Amber Moores A. Lanessa Shill MD; 12/13/2019 11:55 AM) Patient words: Pt sent at the request of Dr Shelly Bombard for left breast mass detected on recent screening mammogram. Patient underwent diagnostic mammography and core biopsy of 2 adjacent circumscribed masses upper-outer quadrant left breast. Pathology revealed a harmatoma with focal ductal hyperplasia with atypia. Patient denies breast pain, breast mass, nipple discharge or change in the appearance of either breasts.              CLINICAL DATA: Screening recall for left breast masses.  EXAM: DIGITAL DIAGNOSTIC UNILATERAL LEFT MAMMOGRAM WITH CAD AND TOMO  LEFT BREAST ULTRASOUND  COMPARISON: Previous exam(s).  ACR Breast Density Category c: The breast tissue is heterogeneously dense, which may obscure small masses.  FINDINGS: Spot compression tomograms were performed of the left breast. There are 2 adjacent oval circumscribed masses within the upper-outer left breast together measuring approximately 1.5 cm. There is an oval circumscribed mass in the upper inner left breast measuring 0.8 cm.  Mammographic images were processed with CAD.  Targeted ultrasound of the left breast was performed. There is a cyst at 9 o'clock 1 cm from the nipple measuring 0.8 x 0.4 x 0.5 cm. This corresponds well with the mass seen in the inner left breast at mammography. There are 2 adjacent cysts in the left breast at 3 o'clock 7 cm from nipple, one of which measures 1.1 x 0.5 x 0.8 cm and adjacent cyst measures 0.8 x 0.3 x 1 cm. There is an oval hypoechoic mass with margin irregularity in the left breast at 1 o'clock 3 cm from nipple measuring 0.4 x 0.5 x 0.9 cm. This may represent an area of apocrine metaplasia however cannot be  clearly characterized as such. No lymphadenopathy seen in the left axilla.  IMPRESSION: 1. Indeterminate 0.9 cm mass in the left breast at the 1 o'clock position, possibly an area of apocrine metaplasia however cannot be clearly characterized as such.  2. Additional cysts throughout the left breast.  RECOMMENDATION: Recommend ultrasound-guided biopsy of the mass in the left breast at the 1 o'clock position. This is being scheduled for the patient.  I have discussed the findings and recommendations with the patient. If applicable, a reminder letter will be sent to the patient regarding the next appointment.  BI-RADS CATEGORY 4: Suspicious.   Electronically Signed By: Everlean Alstrom M.D. On: 11/08/2019 14:15     Diagnosis Breast, left, needle core biopsy, 1 o'clock - FOCAL ATYPICAL DUCTAL HYPERPLASIA - SEE COMMENT Microscopic Comment The overall lesion appears to be a hamartoma; there is focal atypical ductal hyperplasia present. These results were called to [facility] on November 22, 2019. DAWN BUTLER MD                Diagnosis Breast, left, needle core biopsy, 1 o'clock - FOCAL ATYPICAL DUCTAL HYPERPLASIA - SEE COMMENT Microscopic Comment The overall lesion appears to be a hamartoma; there is focal atypical ductal hyperplasia present. These results were called to [facility] on November 22, 2019. Thressa Sheller MD.  The patient is a 59 year old female.   Diagnostic Studies History Emeline Gins, [place]; 12/13/2019 11:30 AM) Colonoscopy 5-10 years ago Mammogram within last year  Allergies Emeline Gins, CMA; 12/13/2019 11:31 AM) Benazepril HCl *ANTIHYPERTENSIVES* Allergies Reconciled  Medication History (  Emeline Gins, CMA; 12/13/2019 11:32 AM) Atorvastatin Calcium (10MG  Tablet, Oral) Active. amLODIPine Besylate (5MG  Tablet, Oral) Active. metFORMIN HCl (500MG  Tablet, Oral)  Active. hydroCHLOROthiazide (12.5MG  Capsule, Oral) Active. Medications Reconciled  Social History Emeline Gins, [place]; 12/13/2019 11:30 AM) Caffeine use Carbonated beverages. No alcohol use Tobacco use Never smoker.  Family History Emeline Gins, [place]; 12/13/2019 11:30 AM) Alcohol Abuse Father. Arthritis Daughter. Hypertension Daughter, Mother, Sister.  Pregnancy / Birth History Emeline Gins, [place]; 12/13/2019 11:30 AM) Age at menarche 60 years. Gravida 3 Maternal age 98-20 Para 3  Other Problems Emeline Gins, [place]; 12/13/2019 11:30 AM) High blood pressure     Review of Systems Emeline Gins CMA; 12/13/2019 11:30 AM) General Not Present- Appetite Loss, Chills, Fatigue, Fever, Night Sweats, Weight Gain and Weight Loss. Skin Not Present- Change in Wart/Mole, Dryness, Hives, Jaundice, New Lesions, Non-Healing Wounds, Rash and Ulcer. HEENT Not Present- Earache, Hearing Loss, Hoarseness, Nose Bleed, Oral Ulcers, Ringing in the Ears, Seasonal Allergies, Sinus Pain, Sore Throat, Visual Disturbances, Wears glasses/contact lenses and Yellow Eyes. Respiratory Not Present- Bloody sputum, Chronic Cough, Difficulty Breathing, Snoring and Wheezing. Breast Not Present- Breast Mass, Breast Pain, Nipple Discharge and Skin Changes. Cardiovascular Not Present- Chest Pain, Difficulty Breathing Lying Down, Leg Cramps, Palpitations, Rapid Heart Rate, Shortness of Breath and Swelling of Extremities. Gastrointestinal Not Present- Abdominal Pain, Bloating, Bloody Stool, Change in Bowel Habits, Chronic diarrhea, Constipation, Difficulty Swallowing, Excessive gas, Gets full quickly at meals, Hemorrhoids, Indigestion, Nausea, Rectal Pain and Vomiting. Female Genitourinary Not Present- Frequency, Nocturia, Painful Urination, Pelvic Pain and Urgency. Musculoskeletal Not Present- Back Pain, Joint Pain, Joint Stiffness, Muscle Pain, Muscle Weakness and Swelling of Extremities. Neurological Not  Present- Decreased Memory, Fainting, Headaches, Numbness, Seizures, Tingling, Tremor, Trouble walking and Weakness. Psychiatric Not Present- Anxiety, Bipolar, Change in Sleep Pattern, Depression, Fearful and Frequent crying. Hematology Not Present- Blood Thinners, Easy Bruising, Excessive bleeding, Gland problems, HIV and Persistent Infections.  Vitals Emeline Gins CMA; 12/13/2019 11:30 AM) 12/13/2019 11:30 AM Weight: 178.6 lb Height: 67in Body Surface Area: 1.93 m Body Mass Index: 27.97 kg/m  Temp.: 47F  Pulse: 78 (Regular)  BP: 122/80 (Sitting, Left Arm, Standard)        Physical Exam (Tayquan Gassman A. Celine Dishman MD; 12/13/2019 11:57 AM)  General Mental Status-Alert. General Appearance-Consistent with stated age. Hydration-Well hydrated. Voice-Normal.  Chest and Lung Exam Chest and lung exam reveals -quiet, even and easy respiratory effort with no use of accessory muscles and on auscultation, normal breath sounds, no adventitious sounds and normal vocal resonance. Inspection Chest Wall - Normal. Back - normal.  Breast Breast - Left-Symmetric, Non Tender, No Biopsy scars, no Dimpling - Left, No Inflammation, No Lumpectomy scars, No Mastectomy scars, No Peau d' Orange. Breast - Right-Symmetric, Non Tender, No Biopsy scars, no Dimpling - Right, No Inflammation, No Lumpectomy scars, No Mastectomy scars, No Peau d' Orange. Breast Lump-No Palpable Breast Mass.  Cardiovascular Cardiovascular examination reveals -normal heart sounds, regular rate and rhythm with no murmurs and normal pedal pulses bilaterally.  Abdomen Inspection Inspection of the abdomen reveals - No Hernias. Skin - Scar - no surgical scars. Palpation/Percussion Palpation and Percussion of the abdomen reveal - Soft, Non Tender, No Rebound tenderness, No Rigidity (guarding) and No hepatosplenomegaly. Auscultation Auscultation of the abdomen reveals - Bowel sounds  normal.  Neurologic Neurologic evaluation reveals -alert and oriented x 3 with no impairment of recent or remote memory. Mental Status-Normal.  Neuropsychiatric Examination of related systems reveals-The patient is well-nourished and well-groomed. Orientation-oriented X3.  Musculoskeletal Normal Exam - Left-Upper Extremity Strength Normal and Lower Extremity Strength Normal. Normal Exam - Right-Upper Extremity Strength Normal and Lower Extremity Strength Normal.  Lymphatic Head & Neck  General Head & Neck Lymphatics: Bilateral - Description - Normal. Axillary  General Axillary Region: Bilateral - Description - Normal. Tenderness - Non Tender.    Assessment & Plan (Manly Nestle A. Josiah Wojtaszek MD; 12/13/2019 11:53 AM)  LEFT BREAST MASS (N63.20) Impression: harmatoma with atypia left breast recommend excision due to possible upgrade risk Risk of lumpectomy include bleeding, infection, seroma, more surgery, use of seed/wire, wound care, cosmetic deformity and the need for other treatments, death , blood clots, death. Pt agrees to proceed.  Total time 45 minutes face-to-face, examination, history taking, reviewing medical records, reviewing mammogram, reviewing pathology, and documentation  Current Plans You are being scheduled for surgery- Our schedulers will call you.  You should hear from our office's scheduling department within 5 working days about the location, date, and time of surgery. We try to make accommodations for patient's preferences in scheduling surgery, but sometimes the OR schedule or the surgeon's schedule prevents [place] from making those accommodations.  If you have not heard from our office [phone]) in 5 working days, call the office and ask for your surgeon's nurse.  If you have other questions about your diagnosis, plan, or surgery, call the office and ask for your surgeon's nurse.  Pt Education - [facility] Breast Biopsy HCI: discussed with patient and  provided information.

## 2019-12-14 DIAGNOSIS — H25013 Cortical age-related cataract, bilateral: Secondary | ICD-10-CM | POA: Diagnosis not present

## 2019-12-14 DIAGNOSIS — H40013 Open angle with borderline findings, low risk, bilateral: Secondary | ICD-10-CM | POA: Diagnosis not present

## 2019-12-14 DIAGNOSIS — H2513 Age-related nuclear cataract, bilateral: Secondary | ICD-10-CM | POA: Diagnosis not present

## 2019-12-14 DIAGNOSIS — H1045 Other chronic allergic conjunctivitis: Secondary | ICD-10-CM | POA: Diagnosis not present

## 2019-12-18 ENCOUNTER — Encounter: Payer: Self-pay | Admitting: Family Medicine

## 2020-02-01 ENCOUNTER — Other Ambulatory Visit: Payer: Self-pay | Admitting: Surgery

## 2020-02-01 DIAGNOSIS — N6320 Unspecified lump in the left breast, unspecified quadrant: Secondary | ICD-10-CM

## 2020-02-18 ENCOUNTER — Encounter (HOSPITAL_BASED_OUTPATIENT_CLINIC_OR_DEPARTMENT_OTHER): Payer: Self-pay | Admitting: Surgery

## 2020-02-18 ENCOUNTER — Other Ambulatory Visit: Payer: Self-pay

## 2020-02-21 ENCOUNTER — Other Ambulatory Visit (HOSPITAL_COMMUNITY)
Admission: RE | Admit: 2020-02-21 | Discharge: 2020-02-21 | Disposition: A | Discharge status: Home / Self Care | Payer: BC Managed Care – PPO | Source: Ambulatory Visit | Attending: Surgery | Admitting: Surgery

## 2020-02-21 ENCOUNTER — Encounter (HOSPITAL_BASED_OUTPATIENT_CLINIC_OR_DEPARTMENT_OTHER)
Admission: RE | Admit: 2020-02-21 | Discharge: 2020-02-21 | Disposition: A | Discharge status: Home / Self Care | Payer: BC Managed Care – PPO | Source: Ambulatory Visit | Attending: Surgery | Admitting: Surgery

## 2020-02-21 DIAGNOSIS — Z01818 Encounter for other preprocedural examination: Secondary | ICD-10-CM | POA: Insufficient documentation

## 2020-02-21 DIAGNOSIS — Z20822 Contact with and (suspected) exposure to covid-19: Secondary | ICD-10-CM | POA: Insufficient documentation

## 2020-02-21 DIAGNOSIS — Z01812 Encounter for preprocedural laboratory examination: Secondary | ICD-10-CM | POA: Diagnosis not present

## 2020-02-21 LAB — BASIC METABOLIC PANEL
Anion gap: 9 (ref 5–15)
BUN: 16 mg/dL (ref 6–20)
CO2: 21 mmol/L — ABNORMAL LOW (ref 22–32)
Calcium: 9.1 mg/dL (ref 8.9–10.3)
Chloride: 109 mmol/L (ref 98–111)
Creatinine, Ser: 0.97 mg/dL (ref 0.44–1.00)
GFR calc Af Amer: >60 mL/min (ref >60)
GFR calc non Af Amer: >60 mL/min (ref >60)
Glucose, Bld: 129 mg/dL — ABNORMAL HIGH (ref 70–99)
Potassium: 3.5 mmol/L (ref 3.5–5.1)
Sodium: 139 mmol/L (ref 135–145)

## 2020-02-21 LAB — SARS CORONAVIRUS 2 (TAT 6-24 HRS): SARS Coronavirus 2: NEGATIVE

## 2020-02-21 NOTE — Progress Notes (Signed)

## 2020-02-23 ENCOUNTER — Ambulatory Visit
Admission: RE | Admit: 2020-02-23 | Discharge: 2020-02-23 | Disposition: A | Discharge status: Home / Self Care | Payer: BC Managed Care – PPO | Source: Ambulatory Visit | Attending: Surgery | Admitting: Surgery

## 2020-02-23 ENCOUNTER — Other Ambulatory Visit: Payer: Self-pay

## 2020-02-23 DIAGNOSIS — N6320 Unspecified lump in the left breast, unspecified quadrant: Secondary | ICD-10-CM

## 2020-02-23 DIAGNOSIS — N6092 Unspecified benign mammary dysplasia of left breast: Secondary | ICD-10-CM | POA: Diagnosis not present

## 2020-02-24 ENCOUNTER — Encounter (HOSPITAL_BASED_OUTPATIENT_CLINIC_OR_DEPARTMENT_OTHER)
Admission: RE | Disposition: A | Discharge status: Home / Self Care | Payer: Self-pay | Source: Home / Self Care | Attending: Surgery

## 2020-02-24 ENCOUNTER — Other Ambulatory Visit: Payer: Self-pay

## 2020-02-24 ENCOUNTER — Ambulatory Visit (HOSPITAL_BASED_OUTPATIENT_CLINIC_OR_DEPARTMENT_OTHER): Payer: BC Managed Care – PPO | Admitting: Certified Registered"

## 2020-02-24 ENCOUNTER — Encounter (HOSPITAL_BASED_OUTPATIENT_CLINIC_OR_DEPARTMENT_OTHER): Payer: Self-pay | Admitting: Surgery

## 2020-02-24 ENCOUNTER — Ambulatory Visit (HOSPITAL_BASED_OUTPATIENT_CLINIC_OR_DEPARTMENT_OTHER)
Admission: RE | Admit: 2020-02-24 | Discharge: 2020-02-24 | Disposition: A | Discharge status: Home / Self Care | Payer: BC Managed Care – PPO | Attending: Surgery | Admitting: Surgery

## 2020-02-24 ENCOUNTER — Ambulatory Visit
Admission: RE | Admit: 2020-02-24 | Discharge: 2020-02-24 | Disposition: A | Discharge status: Home / Self Care | Payer: BC Managed Care – PPO | Source: Ambulatory Visit | Attending: Surgery | Admitting: Surgery

## 2020-02-24 DIAGNOSIS — D242 Benign neoplasm of left breast: Secondary | ICD-10-CM | POA: Diagnosis not present

## 2020-02-24 DIAGNOSIS — N6320 Unspecified lump in the left breast, unspecified quadrant: Secondary | ICD-10-CM

## 2020-02-24 DIAGNOSIS — N6022 Fibroadenosis of left breast: Secondary | ICD-10-CM | POA: Diagnosis not present

## 2020-02-24 DIAGNOSIS — E7800 Pure hypercholesterolemia, unspecified: Secondary | ICD-10-CM | POA: Diagnosis not present

## 2020-02-24 DIAGNOSIS — N6092 Unspecified benign mammary dysplasia of left breast: Secondary | ICD-10-CM | POA: Insufficient documentation

## 2020-02-24 DIAGNOSIS — N6489 Other specified disorders of breast: Secondary | ICD-10-CM | POA: Diagnosis not present

## 2020-02-24 DIAGNOSIS — Z7984 Long term (current) use of oral hypoglycemic drugs: Secondary | ICD-10-CM | POA: Diagnosis not present

## 2020-02-24 DIAGNOSIS — Z79899 Other long term (current) drug therapy: Secondary | ICD-10-CM | POA: Insufficient documentation

## 2020-02-24 DIAGNOSIS — E119 Type 2 diabetes mellitus without complications: Secondary | ICD-10-CM | POA: Insufficient documentation

## 2020-02-24 DIAGNOSIS — I10 Essential (primary) hypertension: Secondary | ICD-10-CM | POA: Insufficient documentation

## 2020-02-24 HISTORY — DX: Type 2 diabetes mellitus without complications: E11.9

## 2020-02-24 HISTORY — DX: Unspecified lump in the left breast, unspecified quadrant: N63.20

## 2020-02-24 HISTORY — PX: BREAST LUMPECTOMY WITH RADIOACTIVE SEED LOCALIZATION: SHX6424

## 2020-02-24 LAB — GLUCOSE, CAPILLARY
Glucose-Capillary: 97 mg/dL (ref 70–99)
Glucose-Capillary: 113 mg/dL — ABNORMAL HIGH (ref 70–99)

## 2020-02-24 SURGERY — BREAST LUMPECTOMY WITH RADIOACTIVE SEED LOCALIZATION
Anesthesia: General | Site: Breast | Laterality: Left

## 2020-02-24 MED ORDER — MIDAZOLAM HCL 5 MG/5ML IJ SOLN
INTRAMUSCULAR | Status: DC | PRN
  Administered 2020-02-24: 2 mg via INTRAVENOUS

## 2020-02-24 MED ORDER — MIDAZOLAM HCL 2 MG/2ML IJ SOLN
INTRAMUSCULAR | Status: AC
  Filled 2020-02-24: qty 2

## 2020-02-24 MED ORDER — DEXAMETHASONE SODIUM PHOSPHATE 10 MG/ML IJ SOLN
INTRAMUSCULAR | Status: AC
  Filled 2020-02-24: qty 1

## 2020-02-24 MED ORDER — GLYCOPYRROLATE 0.2 MG/ML IJ SOLN
INTRAMUSCULAR | Status: DC | PRN
Start: 2020-02-24 — End: 2020-02-24
  Administered 2020-02-24 (×2): .1 mg via INTRAVENOUS

## 2020-02-24 MED ORDER — CELECOXIB 200 MG PO CAPS
200.0000 mg | ORAL_CAPSULE | ORAL | Status: AC
  Administered 2020-02-24: 200 mg via ORAL

## 2020-02-24 MED ORDER — GABAPENTIN 300 MG PO CAPS
300.0000 mg | ORAL_CAPSULE | ORAL | Status: AC
  Administered 2020-02-24: 300 mg via ORAL

## 2020-02-24 MED ORDER — LIDOCAINE HCL (CARDIAC) PF 100 MG/5ML IV SOSY
PREFILLED_SYRINGE | INTRAVENOUS | Status: DC | PRN
  Administered 2020-02-24: 50 mg via INTRAVENOUS

## 2020-02-24 MED ORDER — CHLORHEXIDINE GLUCONATE CLOTH 2 % EX PADS: 6.0000 | MEDICATED_PAD | Freq: Once | CUTANEOUS | Status: DC

## 2020-02-24 MED ORDER — ONDANSETRON HCL 4 MG/2ML IJ SOLN
INTRAMUSCULAR | Status: DC | PRN
  Administered 2020-02-24: 4 mg via INTRAVENOUS

## 2020-02-24 MED ORDER — LACTATED RINGERS IV SOLN: INTRAVENOUS | Status: DC

## 2020-02-24 MED ORDER — PROPOFOL 10 MG/ML IV BOLUS
INTRAVENOUS | Status: AC
  Filled 2020-02-24: qty 20

## 2020-02-24 MED ORDER — MEPERIDINE HCL 25 MG/ML IJ SOLN: 6.2500 mg | INTRAMUSCULAR | Status: DC | PRN

## 2020-02-24 MED ORDER — HYDROMORPHONE HCL 1 MG/ML IJ SOLN: 0.2500 mg | INTRAMUSCULAR | Status: DC | PRN

## 2020-02-24 MED ORDER — BUPIVACAINE HCL (PF) 0.25 % IJ SOLN
INTRAMUSCULAR | Status: DC | PRN
  Administered 2020-02-24: 17 mL

## 2020-02-24 MED ORDER — EPHEDRINE 5 MG/ML INJ
INTRAVENOUS | Status: AC
  Filled 2020-02-24: qty 10

## 2020-02-24 MED ORDER — CEFAZOLIN SODIUM-DEXTROSE 2-4 GM/100ML-% IV SOLN
INTRAVENOUS | Status: AC
  Filled 2020-02-24: qty 100

## 2020-02-24 MED ORDER — ONDANSETRON HCL 4 MG/2ML IJ SOLN
INTRAMUSCULAR | Status: AC
  Filled 2020-02-24: qty 2

## 2020-02-24 MED ORDER — DEXAMETHASONE SODIUM PHOSPHATE 4 MG/ML IJ SOLN
INTRAMUSCULAR | Status: DC | PRN
  Administered 2020-02-24: 10 mg via INTRAVENOUS

## 2020-02-24 MED ORDER — PROPOFOL 10 MG/ML IV BOLUS
INTRAVENOUS | Status: DC | PRN
  Administered 2020-02-24: 180 mg via INTRAVENOUS

## 2020-02-24 MED ORDER — EPHEDRINE SULFATE 50 MG/ML IJ SOLN
INTRAMUSCULAR | Status: DC | PRN
Start: 2020-02-24 — End: 2020-02-24
  Administered 2020-02-24: 10 mg via INTRAVENOUS

## 2020-02-24 MED ORDER — CELECOXIB 200 MG PO CAPS
ORAL_CAPSULE | ORAL | Status: AC
  Filled 2020-02-24: qty 1

## 2020-02-24 MED ORDER — FENTANYL CITRATE (PF) 100 MCG/2ML IJ SOLN
INTRAMUSCULAR | Status: AC
  Filled 2020-02-24: qty 4

## 2020-02-24 MED ORDER — ACETAMINOPHEN 500 MG PO TABS
1000.0000 mg | ORAL_TABLET | ORAL | Status: AC
  Administered 2020-02-24: 1000 mg via ORAL

## 2020-02-24 MED ORDER — GABAPENTIN 300 MG PO CAPS
ORAL_CAPSULE | ORAL | Status: AC
  Filled 2020-02-24: qty 1

## 2020-02-24 MED ORDER — CEFAZOLIN SODIUM-DEXTROSE 2-4 GM/100ML-% IV SOLN
2.0000 g | INTRAVENOUS | Status: AC
  Administered 2020-02-24: 2 g via INTRAVENOUS

## 2020-02-24 MED ORDER — FENTANYL CITRATE (PF) 100 MCG/2ML IJ SOLN
INTRAMUSCULAR | Status: DC | PRN
  Administered 2020-02-24: 100 ug via INTRAVENOUS

## 2020-02-24 MED ORDER — ONDANSETRON HCL 4 MG/2ML IJ SOLN: 4.0000 mg | Freq: Once | INTRAMUSCULAR | Status: DC | PRN

## 2020-02-24 MED ORDER — ACETAMINOPHEN 500 MG PO TABS
ORAL_TABLET | ORAL | Status: AC
  Filled 2020-02-24: qty 2

## 2020-02-24 MED ORDER — LIDOCAINE 2% (20 MG/ML) 5 ML SYRINGE
INTRAMUSCULAR | Status: AC
  Filled 2020-02-24: qty 5

## 2020-02-24 MED ORDER — GLYCOPYRROLATE PF 0.2 MG/ML IJ SOSY
PREFILLED_SYRINGE | INTRAMUSCULAR | Status: AC
  Filled 2020-02-24: qty 1

## 2020-02-24 MED ORDER — HYDROCODONE-ACETAMINOPHEN 5-325 MG PO TABS
1.0000 | ORAL_TABLET | Freq: Four times a day (QID) | ORAL | 0 refills | Status: DC | PRN
Start: 2020-02-24 — End: 2020-06-07

## 2020-02-24 SURGICAL SUPPLY — 56 items
ADH SKN CLS APL DERMABOND .7 (GAUZE/BANDAGES/DRESSINGS) ×1
APL PRP STRL LF DISP 70% ISPRP (MISCELLANEOUS) ×1
APPLIER CLIP 9.375 MED OPEN (MISCELLANEOUS)
APR CLP MED 9.3 20 MLT OPN (MISCELLANEOUS)
BINDER BREAST LRG (GAUZE/BANDAGES/DRESSINGS) IMPLANT
BINDER BREAST MEDIUM (GAUZE/BANDAGES/DRESSINGS) IMPLANT
BINDER BREAST XLRG (GAUZE/BANDAGES/DRESSINGS) ×2 IMPLANT
BINDER BREAST XXLRG (GAUZE/BANDAGES/DRESSINGS) IMPLANT
BLADE SURG 15 STRL LF DISP TIS (BLADE) ×1 IMPLANT
BLADE SURG 15 STRL SS (BLADE) ×3
CANISTER SUC SOCK COL 7IN (MISCELLANEOUS) IMPLANT
CANISTER SUCT 1200ML W/VALVE (MISCELLANEOUS) IMPLANT
CHLORAPREP W/TINT 26 (MISCELLANEOUS) ×3 IMPLANT
CLIP APPLIE 9.375 MED OPEN (MISCELLANEOUS) IMPLANT
COVER BACK TABLE 60X90IN (DRAPES) ×3 IMPLANT
COVER MAYO STAND STRL (DRAPES) ×3 IMPLANT
COVER PROBE W GEL 5X96 (DRAPES) ×3 IMPLANT
COVER WAND RF STERILE (DRAPES) IMPLANT
DECANTER SPIKE VIAL GLASS SM (MISCELLANEOUS) IMPLANT
DERMABOND ADVANCED (GAUZE/BANDAGES/DRESSINGS) ×2
DERMABOND ADVANCED .7 DNX12 (GAUZE/BANDAGES/DRESSINGS) ×1 IMPLANT
DRAPE LAPAROSCOPIC ABDOMINAL (DRAPES) IMPLANT
DRAPE LAPAROTOMY 100X72 PEDS (DRAPES) ×3 IMPLANT
DRAPE UTILITY XL STRL (DRAPES) ×3 IMPLANT
ELECT COATED BLADE 2.86 ST (ELECTRODE) ×3 IMPLANT
ELECT REM PT RETURN 9FT ADLT (ELECTROSURGICAL) ×3
ELECTRODE REM PT RTRN 9FT ADLT (ELECTROSURGICAL) ×1 IMPLANT
GLOVE BIOGEL PI IND STRL 6.5 (GLOVE) IMPLANT
GLOVE BIOGEL PI IND STRL 7.0 (GLOVE) IMPLANT
GLOVE BIOGEL PI IND STRL 8 (GLOVE) ×1 IMPLANT
GLOVE BIOGEL PI INDICATOR 6.5 (GLOVE) ×2
GLOVE BIOGEL PI INDICATOR 7.0 (GLOVE) ×2
GLOVE BIOGEL PI INDICATOR 8 (GLOVE) ×2
GLOVE ECLIPSE 6.5 STRL STRAW (GLOVE) ×2 IMPLANT
GLOVE ECLIPSE 8.0 STRL XLNG CF (GLOVE) ×3 IMPLANT
GOWN STRL REUS W/ TWL LRG LVL3 (GOWN DISPOSABLE) ×2 IMPLANT
GOWN STRL REUS W/TWL LRG LVL3 (GOWN DISPOSABLE) ×6
HEMOSTAT ARISTA ABSORB 3G PWDR (HEMOSTASIS) IMPLANT
HEMOSTAT SNOW SURGICEL 2X4 (HEMOSTASIS) IMPLANT
KIT MARKER MARGIN INK (KITS) ×3 IMPLANT
NDL HYPO 25X1 1.5 SAFETY (NEEDLE) ×1 IMPLANT
NEEDLE HYPO 25X1 1.5 SAFETY (NEEDLE) ×3 IMPLANT
NS IRRIG 1000ML POUR BTL (IV SOLUTION) ×1 IMPLANT
PENCIL SMOKE EVACUATOR (MISCELLANEOUS) ×3 IMPLANT
SET BASIN DAY SURGERY F.S. (CUSTOM PROCEDURE TRAY) ×3 IMPLANT
SLEEVE SCD COMPRESS KNEE MED (MISCELLANEOUS) ×3 IMPLANT
SPONGE LAP 4X18 RFD (DISPOSABLE) ×3 IMPLANT
SUT MNCRL AB 4-0 PS2 18 (SUTURE) ×3 IMPLANT
SUT SILK 2 0 SH (SUTURE) IMPLANT
SUT VICRYL 3-0 CR8 SH (SUTURE) ×3 IMPLANT
SYR CONTROL 10ML LL (SYRINGE) ×3 IMPLANT
TOWEL GREEN STERILE FF (TOWEL DISPOSABLE) ×3 IMPLANT
TRAY FAXITRON CT DISP (TRAY / TRAY PROCEDURE) ×3 IMPLANT
TUBE CONNECTING 20'X1/4 (TUBING)
TUBE CONNECTING 20X1/4 (TUBING) IMPLANT
YANKAUER SUCT BULB TIP NO VENT (SUCTIONS) IMPLANT

## 2020-02-24 NOTE — Anesthesia Preprocedure Evaluation (Signed)
Anesthesia Evaluation  Patient identified by MRN, date of birth, ID band Patient awake    Reviewed: Allergy & Precautions, NPO status , Patient's Chart, lab work & pertinent test results  Airway Mallampati: I  TM Distance: >3 FB Neck ROM: Full    Dental   Pulmonary    Pulmonary exam normal        Cardiovascular hypertension, Pt. on medications Normal cardiovascular exam     Neuro/Psych    GI/Hepatic   Endo/Other  diabetes, Type 2  Renal/GU      Musculoskeletal   Abdominal   Peds  Hematology   Anesthesia Other Findings   Reproductive/Obstetrics                             Anesthesia Physical Anesthesia Plan  ASA: II  Anesthesia Plan: General   Post-op Pain Management:    Induction: Intravenous  PONV Risk Score and Plan: 3 and Ondansetron, Midazolam and Treatment may vary due to age or medical condition  Airway Management Planned: LMA  Additional Equipment:   Intra-op Plan:   Post-operative Plan: Extubation in OR  Informed Consent: I have reviewed the patients History and Physical, chart, labs and discussed the procedure including the risks, benefits and alternatives for the proposed anesthesia with the patient or authorized representative who has indicated his/her understanding and acceptance.       Plan Discussed with: CRNA and Surgeon  Anesthesia Plan Comments:         Anesthesia Quick Evaluation

## 2020-02-24 NOTE — Discharge Instructions (Signed)
[facility] Office Phone Number [phone]  BREAST BIOPSY/ PARTIAL MASTECTOMY: POST OP INSTRUCTIONS  Always review your discharge instruction sheet given to you by the facility where your surgery was performed.  IF YOU HAVE DISABILITY OR FAMILY LEAVE FORMS, YOU MUST BRING THEM TO THE OFFICE FOR PROCESSING.  DO NOT GIVE THEM TO YOUR DOCTOR.  1. A prescription for pain medication may be given to you upon discharge.  Take your pain medication as prescribed, if needed.  If narcotic pain medicine is not needed, then you may take acetaminophen (Tylenol) or ibuprofen (Advil) as needed. 2. Take your usually prescribed medications unless otherwise directed 3. If you need a refill on your pain medication, please contact your pharmacy.  They will contact our office to request authorization.  Prescriptions will not be filled after 5pm or on week-ends. 4. You should eat very light the first 24 hours after surgery, such as soup, crackers, pudding, etc.  Resume your normal diet the day after surgery. 5. Most patients will experience some swelling and bruising in the breast.  Ice packs and a good support bra will help.  Swelling and bruising can take several days to resolve.  6. It is common to experience some constipation if taking pain medication after surgery.  Increasing fluid intake and taking a stool softener will usually help or prevent this problem from occurring.  A mild laxative (Milk of Magnesia or Miralax) should be taken according to package directions if there are no bowel movements after 48 hours. 7. Unless discharge instructions indicate otherwise, you may remove your bandages 24-48 hours after surgery, and you may shower at that time.  You may have steri-strips (small skin tapes) in place directly over the incision.  These strips should be left on the skin for 7-10 days.  If your surgeon used skin glue on the incision, you may shower in 24 hours.  The glue will flake off over the  next 2-3 weeks.  Any sutures or staples will be removed at the office during your follow-up visit. 8. ACTIVITIES:  You may resume regular daily activities (gradually increasing) beginning the next day.  Wearing a good support bra or sports bra minimizes pain and swelling.  You may have sexual intercourse when it is comfortable. a. You may drive when you no longer are taking prescription pain medication, you can comfortably wear a seatbelt, and you can safely maneuver your car and apply brakes. b. RETURN TO WORK:  ______________________________________________________________________________________ 9. You should see your doctor in the office for a follow-up appointment approximately two weeks after your surgery.  Your doctor's nurse will typically make your follow-up appointment when she calls you with your pathology report.  Expect your pathology report 2-3 business days after your surgery.  You may call to check if you do not hear from us after three days. 10. OTHER INSTRUCTIONS: _______________________________________________________________________________________________ _____________________________________________________________________________________________________________________________________ _____________________________________________________________________________________________________________________________________ _____________________________________________________________________________________________________________________________________  WHEN TO CALL YOUR DOCTOR: 1. Fever over 101.0 2. Nausea and/or vomiting. 3. Extreme swelling or bruising. 4. Continued bleeding from incision. 5. Increased pain, redness, or drainage from the incision.  The clinic staff is available to answer your questions during regular business hours.  Please don't hesitate to call and ask to speak to one of the nurses for clinical concerns.  If you have a medical emergency, go to the nearest  emergency room or call 911.  A surgeon from [facility] is always on call at the hospital.  For further questions, please visit [email or web address]        Post Anesthesia Home Care Instructions  Activity: Get plenty of rest for the remainder of the day. A responsible individual must stay with you for 24 hours following the procedure.  For the next 24 hours, DO NOT: -Drive a car -Paediatric nurse -Drink alcoholic beverages -Take any medication unless instructed by your physician -Make any legal decisions or sign important papers.  Meals: Start with liquid foods such as gelatin or soup. Progress to regular foods as tolerated. Avoid greasy, spicy, heavy foods. If nausea and/or vomiting occur, drink only clear liquids until the nausea and/or vomiting subsides. Call your physician if vomiting continues.  Special Instructions/Symptoms: Your throat may feel dry or sore from the anesthesia or the breathing tube placed in your throat during surgery. If this causes discomfort, gargle with warm salt water. The discomfort should disappear within 24 hours.  No Tylenol until 3:15pm if needed. No Ibuprofen/Motrin until 5:15pm if needed.

## 2020-02-24 NOTE — Anesthesia Postprocedure Evaluation (Signed)
Anesthesia Post Note  Patient: Amber Boone  Procedure(s) Performed: LEFT BREAST LUMPECTOMY WITH RADIOACTIVE SEED LOCALIZATION (Left Breast)     Patient location during evaluation: PACU Anesthesia Type: General Level of consciousness: awake and alert Pain management: pain level controlled Vital Signs Assessment: post-procedure vital signs reviewed and stable Respiratory status: spontaneous breathing, nonlabored ventilation, respiratory function stable and patient connected to nasal cannula oxygen Cardiovascular status: blood pressure returned to baseline and stable Postop Assessment: no apparent nausea or vomiting Anesthetic complications: no   No complications documented.  Last Vitals:  Vitals:   02/24/20 1200 02/24/20 1212  BP: 131/70 133/72  Pulse: 81 74  Resp: 20 18  Temp:  36.5 C  SpO2: 99% 95%    Last Pain:  Vitals:   02/24/20 1212  TempSrc:   PainSc: 0-No pain                 Eladio Dentremont DAVID

## 2020-02-24 NOTE — Transfer of Care (Signed)
Immediate Anesthesia Transfer of Care Note  Patient: Amber Boone  Procedure(s) Performed: LEFT BREAST LUMPECTOMY WITH RADIOACTIVE SEED LOCALIZATION (Left Breast)  Patient Location: PACU  Anesthesia Type:General  Level of Consciousness: drowsy and patient cooperative  Airway & Oxygen Therapy: Patient Spontanous Breathing and Patient connected to face mask oxygen  Post-op Assessment: Report given to RN and Post -op Vital signs reviewed and stable  Post vital signs: Reviewed and stable  Last Vitals:  Vitals Value Taken Time  BP    Temp    Pulse 86 02/24/20 1135  Resp 20 02/24/20 1135  SpO2 100 % 02/24/20 1135  Vitals shown include unvalidated device data.  Last Pain:  Vitals:   02/24/20 0911  TempSrc: Oral  PainSc: 0-No pain      Patients Stated Pain Goal: 1 (62/[id number])  Complications: No complications documented.

## 2020-02-24 NOTE — Anesthesia Procedure Notes (Signed)
Procedure Name: LMA Insertion Date/Time: 02/24/2020 10:56 AM Performed by: Marrianne Mood, CRNA Pre-anesthesia Checklist: Patient identified, Emergency Drugs available, Suction available, Patient being monitored and Timeout performed Patient Re-evaluated:Patient Re-evaluated prior to induction Oxygen Delivery Method: Circle system utilized Preoxygenation: Pre-oxygenation with 100% oxygen Induction Type: IV induction Ventilation: Mask ventilation without difficulty LMA: LMA inserted LMA Size: 4.0 Number of attempts: 1 Airway Equipment and Method: Bite block Placement Confirmation: positive ETCO2 Tube secured with: Tape Dental Injury: Teeth and Oropharynx as per pre-operative assessment

## 2020-02-24 NOTE — H&P (Addendum)
Alroy Bailiff [facility] Patient #: [id number] DOB: 06/17/1961 Unknown / Language: Cleophus Molt / Race: Black or African American Female  History of Present Illness  Patient words: Pt sent at the request of Dr Shelly Bombard for left breast mass detected on recent screening mammogram. Patient underwent diagnostic mammography and core biopsy of 2 adjacent circumscribed masses upper-outer quadrant left breast. Pathology revealed a harmatoma with focal ductal hyperplasia with atypia. Patient denies breast pain, breast mass, nipple discharge or change in the appearance of either breasts.              CLINICAL DATA: Screening recall for left breast masses.  EXAM: DIGITAL DIAGNOSTIC UNILATERAL LEFT MAMMOGRAM WITH CAD AND TOMO  LEFT BREAST ULTRASOUND  COMPARISON: Previous exam(s).  ACR Breast Density Category c: The breast tissue is heterogeneously dense, which may obscure small masses.  FINDINGS: Spot compression tomograms were performed of the left breast. There are 2 adjacent oval circumscribed masses within the upper-outer left breast together measuring approximately 1.5 cm. There is an oval circumscribed mass in the upper inner left breast measuring 0.8 cm.  Mammographic images were processed with CAD.  Targeted ultrasound of the left breast was performed. There is a cyst at 9 o'clock 1 cm from the nipple measuring 0.8 x 0.4 x 0.5 cm. This corresponds well with the mass seen in the inner left breast at mammography. There are 2 adjacent cysts in the left breast at 3 o'clock 7 cm from nipple, one of which measures 1.1 x 0.5 x 0.8 cm and adjacent cyst measures 0.8 x 0.3 x 1 cm. There is an oval hypoechoic mass with margin irregularity in the left breast at 1 o'clock 3 cm from nipple measuring 0.4 x 0.5 x 0.9 cm. This may represent an area of apocrine metaplasia however cannot be clearly characterized as such. No lymphadenopathy seen in  the left axilla.  IMPRESSION: 1. Indeterminate 0.9 cm mass in the left breast at the 1 o'clock position, possibly an area of apocrine metaplasia however cannot be clearly characterized as such.  2. Additional cysts throughout the left breast.  RECOMMENDATION: Recommend ultrasound-guided biopsy of the mass in the left breast at the 1 o'clock position. This is being scheduled for the patient.  I have discussed the findings and recommendations with the patient. If applicable, a reminder letter will be sent to the patient regarding the next appointment.  BI-RADS CATEGORY 4: Suspicious.   Electronically Signed By: Everlean Alstrom M.D. On: 11/08/2019 14:15     Diagnosis Breast, left, needle core biopsy, 1 o'clock - FOCAL ATYPICAL DUCTAL HYPERPLASIA - SEE COMMENT Microscopic Comment The overall lesion appears to be a hamartoma; there is focal atypical ductal hyperplasia present. These results were called to [facility] on November 22, 2019. DAWN BUTLER MD                Diagnosis Breast, left, needle core biopsy, 1 o'clock - FOCAL ATYPICAL DUCTAL HYPERPLASIA - SEE COMMENT Microscopic Comment The overall lesion appears to be a hamartoma; there is focal atypical ductal hyperplasia present. These results were called to [facility] on November 22, 2019. Thressa Sheller MD.  The patient is a 59 year old female.   Diagnostic Studies History Colonoscopy 5-10 years ago Mammogram within last year  Allergies  Benazepril HCl *ANTIHYPERTENSIVES* Allergies Reconciled  Medication History  Atorvastatin Calcium (10MG  Tablet, Oral) Active. amLODIPine Besylate (5MG  Tablet, Oral) Active. metFORMIN HCl (500MG  Tablet, Oral) Active. hydroCHLOROthiazide (12.5MG   Capsule, Oral) Active. Medications Reconciled  Social History Caffeine use Carbonated beverages. No alcohol use Tobacco use Never  smoker.  Family History Alcohol Abuse Father. Arthritis Daughter. Hypertension Daughter, Mother, Sister.  Pregnancy / Birth History Age at menarche 15 years. Gravida 3 Maternal age 63-20 Para 3  Other Problems High blood pressure     Review of Systems Emeline Gins CMA; 12/13/2019 11:30 AM) General Not Present- Appetite Loss, Chills, Fatigue, Fever, Night Sweats, Weight Gain and Weight Loss. Skin Not Present- Change in Wart/Mole, Dryness, Hives, Jaundice, New Lesions, Non-Healing Wounds, Rash and Ulcer. HEENT Not Present- Earache, Hearing Loss, Hoarseness, Nose Bleed, Oral Ulcers, Ringing in the Ears, Seasonal Allergies, Sinus Pain, Sore Throat, Visual Disturbances, Wears glasses/contact lenses and Yellow Eyes. Respiratory Not Present- Bloody sputum, Chronic Cough, Difficulty Breathing, Snoring and Wheezing. Breast Not Present- Breast Mass, Breast Pain, Nipple Discharge and Skin Changes. Cardiovascular Not Present- Chest Pain, Difficulty Breathing Lying Down, Leg Cramps, Palpitations, Rapid Heart Rate, Shortness of Breath and Swelling of Extremities. Gastrointestinal Not Present- Abdominal Pain, Bloating, Bloody Stool, Change in Bowel Habits, Chronic diarrhea, Constipation, Difficulty Swallowing, Excessive gas, Gets full quickly at meals, Hemorrhoids, Indigestion, Nausea, Rectal Pain and Vomiting. Female Genitourinary Not Present- Frequency, Nocturia, Painful Urination, Pelvic Pain and Urgency. Musculoskeletal Not Present- Back Pain, Joint Pain, Joint Stiffness, Muscle Pain, Muscle Weakness and Swelling of Extremities. Neurological Not Present- Decreased Memory, Fainting, Headaches, Numbness, Seizures, Tingling, Tremor, Trouble walking and Weakness. Psychiatric Not Present- Anxiety, Bipolar, Change in Sleep Pattern, Depression, Fearful and Frequent crying. Hematology Not Present- Blood Thinners, Easy Bruising, Excessive bleeding, Gland problems, HIV and Persistent  Infections.  Vitals 12/13/2019 11:30 AM Weight: 178.6 lb Height: 67in Body Surface Area: 1.93 m Body Mass Index: 27.97 kg/m  Temp.: 43F  Pulse: 78 (Regular)  BP: 122/80 (Sitting, Left Arm, Standard)        Physical Exam General Mental Status-Alert. General Appearance-Consistent with stated age. Hydration-Well hydrated. Voice-Normal.  Chest and Lung Exam Chest and lung exam reveals -quiet, even and easy respiratory effort with no use of accessory muscles and on auscultation, normal breath sounds, no adventitious sounds and normal vocal resonance. Inspection Chest Wall - Normal. Back - normal.  Breast Breast - Left-Symmetric, Non Tender, No Biopsy scars, no Dimpling - Left, No Inflammation, No Lumpectomy scars, No Mastectomy scars, No Peau d' Orange. Breast - Right-Symmetric, Non Tender, No Biopsy scars, no Dimpling - Right, No Inflammation, No Lumpectomy scars, No Mastectomy scars, No Peau d' Orange. Breast Lump-No Palpable Breast Mass.  Cardiovascular Cardiovascular examination reveals -normal heart sounds, regular rate and rhythm with no murmurs and normal pedal pulses bilaterally.  Abdomen Inspection Inspection of the abdomen reveals - No Hernias. Skin - Scar - no surgical scars. Palpation/Percussion Palpation and Percussion of the abdomen reveal - Soft, Non Tender, No Rebound tenderness, No Rigidity (guarding) and No hepatosplenomegaly. Auscultation Auscultation of the abdomen reveals - Bowel sounds normal.  Neurologic Neurologic evaluation reveals -alert and oriented x 3 with no impairment of recent or remote memory. Mental Status-Normal.  Neuropsychiatric Examination of related systems reveals-The patient is well-nourished and well-groomed. Orientation-oriented X3.  Musculoskeletal Normal Exam - Left-Upper Extremity Strength Normal and Lower Extremity Strength Normal. Normal Exam - Right-Upper  Extremity Strength Normal and Lower Extremity Strength Normal.  Lymphatic Head & Neck  General Head & Neck Lymphatics: Bilateral - Description - Normal. Axillary  General Axillary Region: Bilateral - Description - Normal. Tenderness - Non Tender.    Assessment & Plan  LEFT BREAST  MASS (N63.20) Impression: harmatoma with atypia left breast recommend excision due to possible upgrade risk Risk of lumpectomy include bleeding, infection, seroma, more surgery, use of seed/wire, wound care, cosmetic deformity and the need for other treatments, death , blood clots, death. Pt agrees to proceed.  Total time 45 minutes face-to-face, examination, history taking, reviewing medical records, reviewing mammogram, reviewing pathology, and documentation  Current Plans You are being scheduled for surgery- Our schedulers will call you.  You should hear from our office's scheduling department within 5 working days about the location, date, and time of surgery. We try to make accommodations for patient's preferences in scheduling surgery, but sometimes the OR schedule or the surgeon's schedule prevents [place] from making those accommodations.  If you have not heard from our office [phone]) in 5 working days, call the office and ask for your surgeon's nurse.  If you have other questions about your diagnosis, plan, or surgery, call the office and ask for your surgeon's nurse.  Pt Education - [facility] Breast Biopsy HCI: discussed with patient and provided information.

## 2020-02-24 NOTE — Op Note (Signed)
Preoperative diagnosis: Left breast atypical ductal hyperplasia  Postop diagnosis: Same  Procedure:     Left     breast seed localized lumpectomy  Surgeon: Erroll Luna, MD  Anesthesia: LMA with local 0.25% Marcaine  EBL: Minimal  Specimen: Left breast tissue with seed and clip verified by Faxitron  Drains: None  IV fluids: Per anesthesia record  Indications for procedure: Patient presents for left breast seed localized lumpectomy for atypical ductal hyperplasia.  Core biopsy showed ADH.  The procedure has been discussed with the patient. Alternatives to surgery have been discussed with the patient.  Risks of surgery include bleeding,  Infection,  Seroma formation, death,  and the need for further surgery.   Neoprobe used to identify seed in breast.  The patient understands and wishes to proceed.     Description of procedure: Patient was met in the holding area and questions were answered.  Neoprobe used to verify seed location in the left breast..  Procedure reviewed all questions answered again.  Complications reviewed.  Patient then taken back to operating room.  She was placed supine upon the OR table.  After induction of LMA anesthesia, left breast was prepped and draped in sterile fashion and timeout performed.  Proper patient, site and procedure verified.  Neoprobe identified seed in the left upper outer quadrant of the breast.  Curvilinear incision made and dissection carried down into the subcutaneous tissues.  All tissue around the seed clip were excised with a grossly negative margin.  Hemostasis achieved with cautery.  Faxitron revealed seed and clip to be in specimen.  This was oriented with ink and sent to pathology.  Wound irrigated and local anesthetic infiltrated.  Hemostasis achieved wound closed with 3-0 Vicryl for Monocryl.  Dermabond applied.  All counts were found to be correct.  Breast binder placed.  The patient was awoke extubated taken to recovery in satisfactory  condition.

## 2020-02-25 ENCOUNTER — Encounter (HOSPITAL_BASED_OUTPATIENT_CLINIC_OR_DEPARTMENT_OTHER): Payer: Self-pay | Admitting: Surgery

## 2020-02-29 LAB — SURGICAL PATHOLOGY

## 2020-03-27 ENCOUNTER — Other Ambulatory Visit: Payer: Self-pay | Admitting: *Deleted

## 2020-03-27 MED ORDER — LANCETS MISC: 1 refills | Status: AC

## 2020-03-27 MED ORDER — CETIRIZINE HCL 10 MG PO TABS: 10.0000 mg | ORAL_TABLET | Freq: Every day | ORAL | 0 refills | Status: DC

## 2020-05-10 ENCOUNTER — Other Ambulatory Visit: Payer: Self-pay | Admitting: Family Medicine

## 2020-05-29 ENCOUNTER — Other Ambulatory Visit: Payer: Self-pay | Admitting: Family Medicine

## 2020-05-31 ENCOUNTER — Ambulatory Visit: Payer: BC Managed Care – PPO | Admitting: Family Medicine

## 2020-06-07 ENCOUNTER — Ambulatory Visit (INDEPENDENT_AMBULATORY_CARE_PROVIDER_SITE_OTHER): Payer: BC Managed Care – PPO | Admitting: Family Medicine

## 2020-06-07 ENCOUNTER — Encounter: Payer: Self-pay | Admitting: Family Medicine

## 2020-06-07 ENCOUNTER — Other Ambulatory Visit: Payer: Self-pay

## 2020-06-07 VITALS — BP 132/88 | HR 74 | Temp 98.1°F | Resp 16 | Ht 67.0 in | Wt 176.0 lb

## 2020-06-07 DIAGNOSIS — E119 Type 2 diabetes mellitus without complications: Secondary | ICD-10-CM

## 2020-06-07 DIAGNOSIS — I10 Essential (primary) hypertension: Secondary | ICD-10-CM | POA: Diagnosis not present

## 2020-06-07 DIAGNOSIS — R7303 Prediabetes: Secondary | ICD-10-CM

## 2020-06-07 DIAGNOSIS — E559 Vitamin D deficiency, unspecified: Secondary | ICD-10-CM

## 2020-06-07 DIAGNOSIS — E7800 Pure hypercholesterolemia, unspecified: Secondary | ICD-10-CM | POA: Diagnosis not present

## 2020-06-07 DIAGNOSIS — Z23 Encounter for immunization: Secondary | ICD-10-CM

## 2020-06-07 NOTE — Progress Notes (Signed)
   Subjective:    Patient ID: Amber Boone, female    DOB: 1961-08-17, 59 y.o.   MRN: [id number]  Patient presents for Follow-up (is not fasting)  Pt here to f/u chronic medical problems DM- last A1C in march 6.9%, taking metformin 500mg  once a day, she has been working on dietary changes, most of her sugars are between 70-100, sometimes times she feels very hungry and feels like she has to eat , but does not get jittery lightheaded  Due for recheck on lipids, she has been on lipitor 10mg  no SE with meds  Weight down 4lbs since March   HTN- no chest pain, no SOB, taking norvasc     Review Of Systems:  GEN- denies fatigue, fever, weight loss,weakness, recent illness HEENT- denies eye drainage, change in vision, nasal discharge, CVS- denies chest pain, palpitations RESP- denies SOB, cough, wheeze ABD- denies N/V, change in stools, abd pain GU- denies dysuria, hematuria, dribbling, incontinence MSK- denies joint pain, muscle aches, injury Neuro- denies headache, dizziness, syncope, seizure activity       Objective:    BP 132/88   Pulse 74   Temp 98.1 F (36.7 C) (Temporal)   Resp 16   Ht 5\' 7"  (1.702 m)   Wt 176 lb (79.8 kg)   SpO2 97%   BMI 27.57 kg/m  GEN- NAD, alert and oriented x3 HEENT- PERRL, EOMI, non injected sclera, pink conjunctiva, MMM, oropharynx clear Neck- Supple, no thyromegaly CVS- RRR, no murmur RESP-CTAB ABD-NABS,soft,NT,ND EXT- No edema Pulses- Radial, DP- 2+        Assessment & Plan:      Problem List Items Addressed This Visit      Unprioritized   Hypertension    Controlled no changes       Relevant Orders   CBC with Differential/Platelet (Completed)   Comprehensive metabolic panel (Completed)   Pure hypercholesterolemia    Continue lipids       Relevant Orders   Lipid panel (Completed)   Type 2 diabetes mellitus without complications ([place]) - Primary    She has made dietary changes Some weightloss reasess A1C may be able  to D/C MTF since most of sugars are below 100 fasting  Continue statin drug       Vitamin D deficiency   Relevant Orders   Vitamin D, 25-hydroxy (Completed)    Other Visit Diagnoses    Need for immunization against influenza       Relevant Orders   Flu Vaccine QUAD 36+ mos IM (Completed)      Note: This dictation was prepared with Dragon dictation along with smaller phrase technology. Any transcriptional errors that result from this process are unintentional.

## 2020-06-07 NOTE — Patient Instructions (Signed)
We will call with lab results Flu shot given F/U end of March for Physical

## 2020-06-08 ENCOUNTER — Encounter: Payer: Self-pay | Admitting: Family Medicine

## 2020-06-08 LAB — COMPREHENSIVE METABOLIC PANEL
AG Ratio: 1.6 (calc) (ref 1.0–2.5)
ALT: 23 U/L (ref 6–29)
AST: 23 U/L (ref 10–35)
Albumin: 4.3 g/dL (ref 3.6–5.1)
Alkaline phosphatase (APISO): 120 U/L (ref 37–153)
BUN: 14 mg/dL (ref 7–25)
CO2: 31 mmol/L (ref 20–32)
Calcium: 10 mg/dL (ref 8.6–10.4)
Chloride: 104 mmol/L (ref 98–110)
Creat: 0.98 mg/dL (ref 0.50–1.05)
Globulin: 2.7 g/dL (calc) (ref 1.9–3.7)
Glucose, Bld: 90 mg/dL (ref 65–99)
Potassium: 4 mmol/L (ref 3.5–5.3)
Sodium: 141 mmol/L (ref 135–146)
Total Bilirubin: 0.3 mg/dL (ref 0.2–1.2)
Total Protein: 7 g/dL (ref 6.1–8.1)

## 2020-06-08 LAB — CBC WITH DIFFERENTIAL/PLATELET
Absolute Monocytes: 614 cells/uL (ref 200–950)
Basophils Absolute: 42 cells/uL (ref 0–200)
Basophils Relative: 0.5 %
Eosinophils Absolute: 83 cells/uL (ref 15–500)
Eosinophils Relative: 1 %
HCT: 43.1 % (ref 35.0–45.0)
Hemoglobin: 14.1 g/dL (ref 11.7–15.5)
Lymphs Abs: 3104 cells/uL (ref 850–3900)
MCH: 27.1 pg (ref 27.0–33.0)
MCHC: 32.7 g/dL (ref 32.0–36.0)
MCV: 82.7 fL (ref 80.0–100.0)
MPV: 11.7 fL (ref 7.5–12.5)
Monocytes Relative: 7.4 %
Neutro Abs: 4457 cells/uL (ref 1500–7800)
Neutrophils Relative %: 53.7 %
Platelets: 301 10*3/uL (ref 140–400)
RBC: 5.21 10*6/uL — ABNORMAL HIGH (ref 3.80–5.10)
RDW: 13.2 % (ref 11.0–15.0)
Total Lymphocyte: 37.4 %
WBC: 8.3 10*3/uL (ref 3.8–10.8)

## 2020-06-08 LAB — LIPID PANEL
Cholesterol: 172 mg/dL (ref <200)
HDL: 57 mg/dL (ref >=50)
LDL Cholesterol (Calc): 94 mg/dL (calc)
Non-HDL Cholesterol (Calc): 115 mg/dL (calc) (ref <130)
Total CHOL/HDL Ratio: 3 (calc) (ref <5.0)
Triglycerides: 116 mg/dL (ref <150)

## 2020-06-08 LAB — VITAMIN D 25 HYDROXY (VIT D DEFICIENCY, FRACTURES): Vit D, 25-Hydroxy: 44 ng/mL (ref 30–100)

## 2020-06-08 LAB — HEMOGLOBIN A1C
Hgb A1c MFr Bld: 6.5 % of total Hgb — ABNORMAL HIGH (ref <5.7)
Mean Plasma Glucose: 140 (calc)
eAG (mmol/L): 7.7 (calc)

## 2020-06-08 NOTE — Assessment & Plan Note (Signed)
Controlled no changes 

## 2020-06-08 NOTE — Assessment & Plan Note (Signed)
She has made dietary changes Some weightloss reasess A1C may be able to D/C MTF since most of sugars are below 100 fasting  Continue statin drug

## 2020-06-08 NOTE — Assessment & Plan Note (Signed)
Continue lipids

## 2020-06-16 ENCOUNTER — Encounter: Payer: Self-pay | Admitting: *Deleted

## 2020-09-07 ENCOUNTER — Other Ambulatory Visit: Payer: Self-pay | Admitting: Family Medicine

## 2020-12-16 ENCOUNTER — Other Ambulatory Visit: Payer: Self-pay | Admitting: Family Medicine

## 2021-03-19 ENCOUNTER — Encounter (HOSPITAL_COMMUNITY): Payer: Self-pay

## 2021-09-18 IMAGING — MG MM BREAST LOCALIZATION CLIP
4 series · 4 of 12 positions shown · non-contrast
Comparison: Previous exam(s).

CLINICAL DATA: Status post ultrasound-guided core biopsy of a mass
in the 1 o'clock region of the left breast.

EXAM:
DIAGNOSTIC LEFT MAMMOGRAM POST ULTRASOUND BIOPSY

[L ML synth-2D]
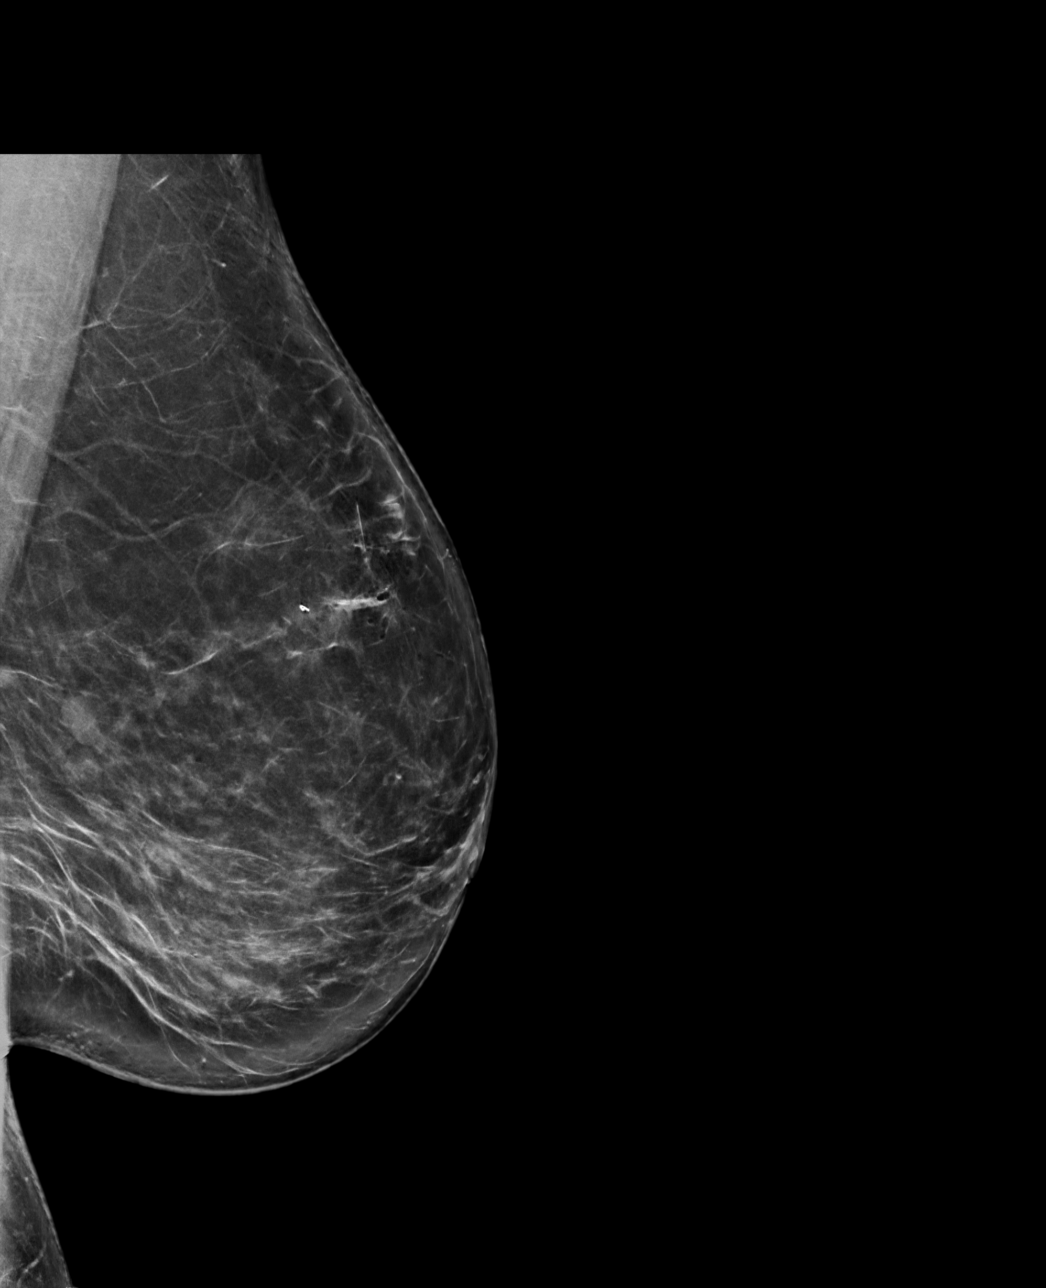

[L CC synth-2D]
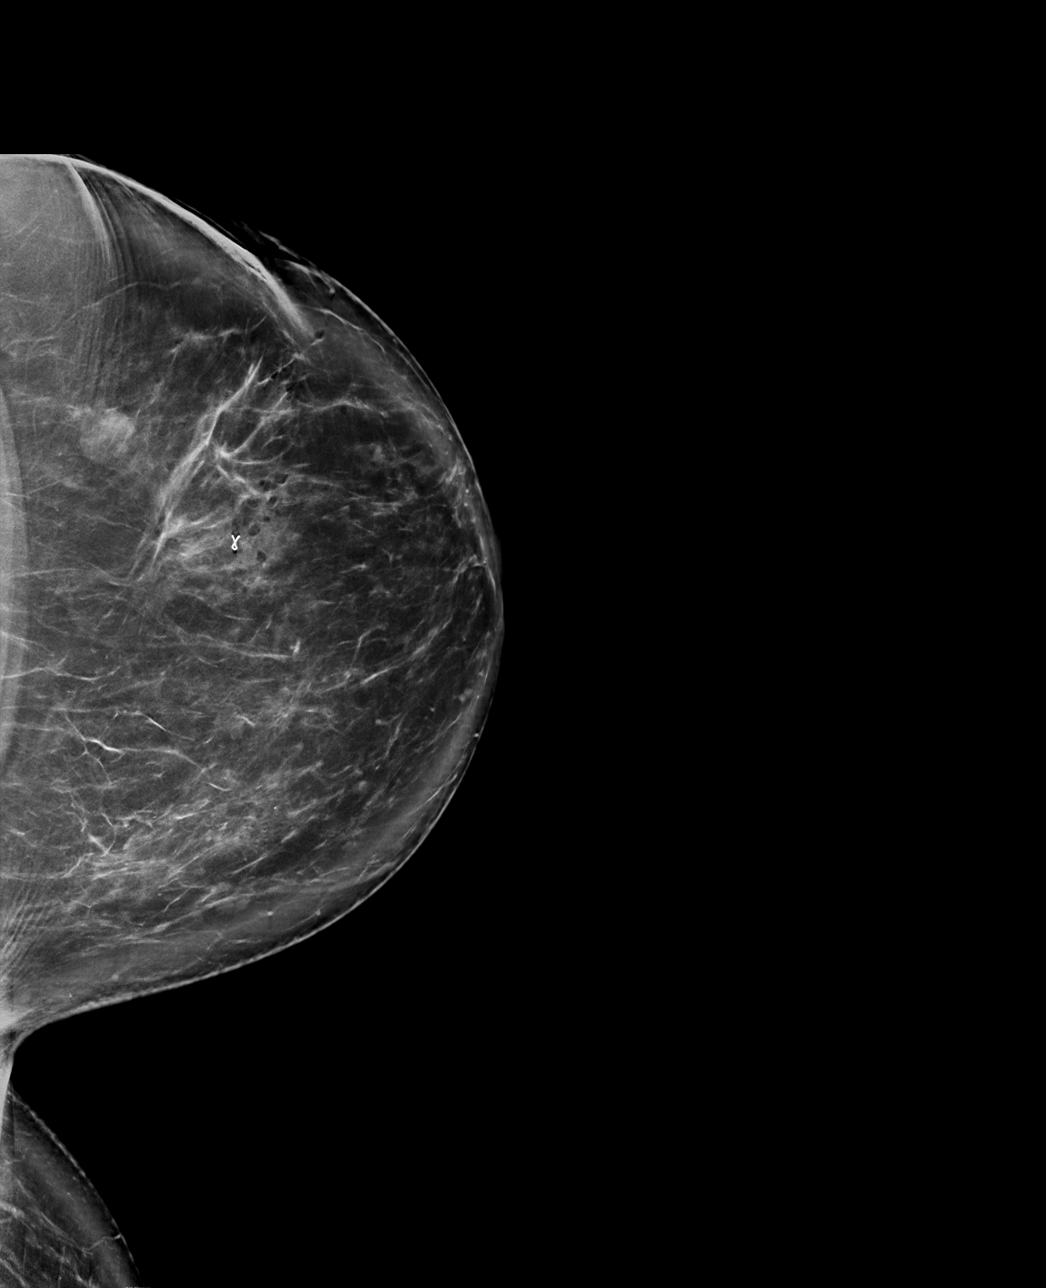

[L CC tomo · tomo slice 51/100.0]
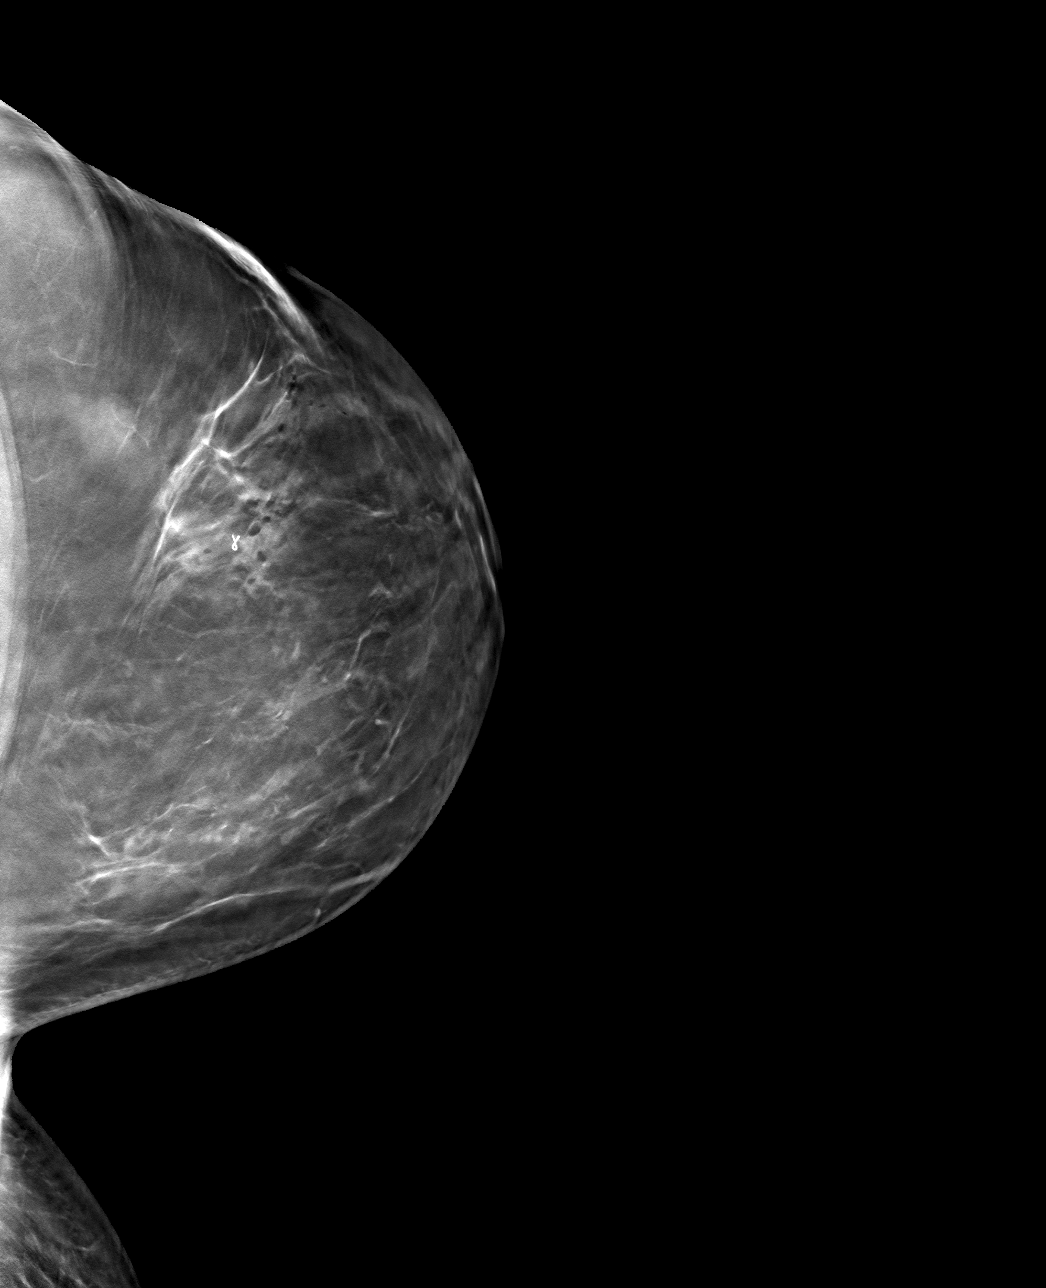

[L ML tomo · tomo slice 47/92.0]
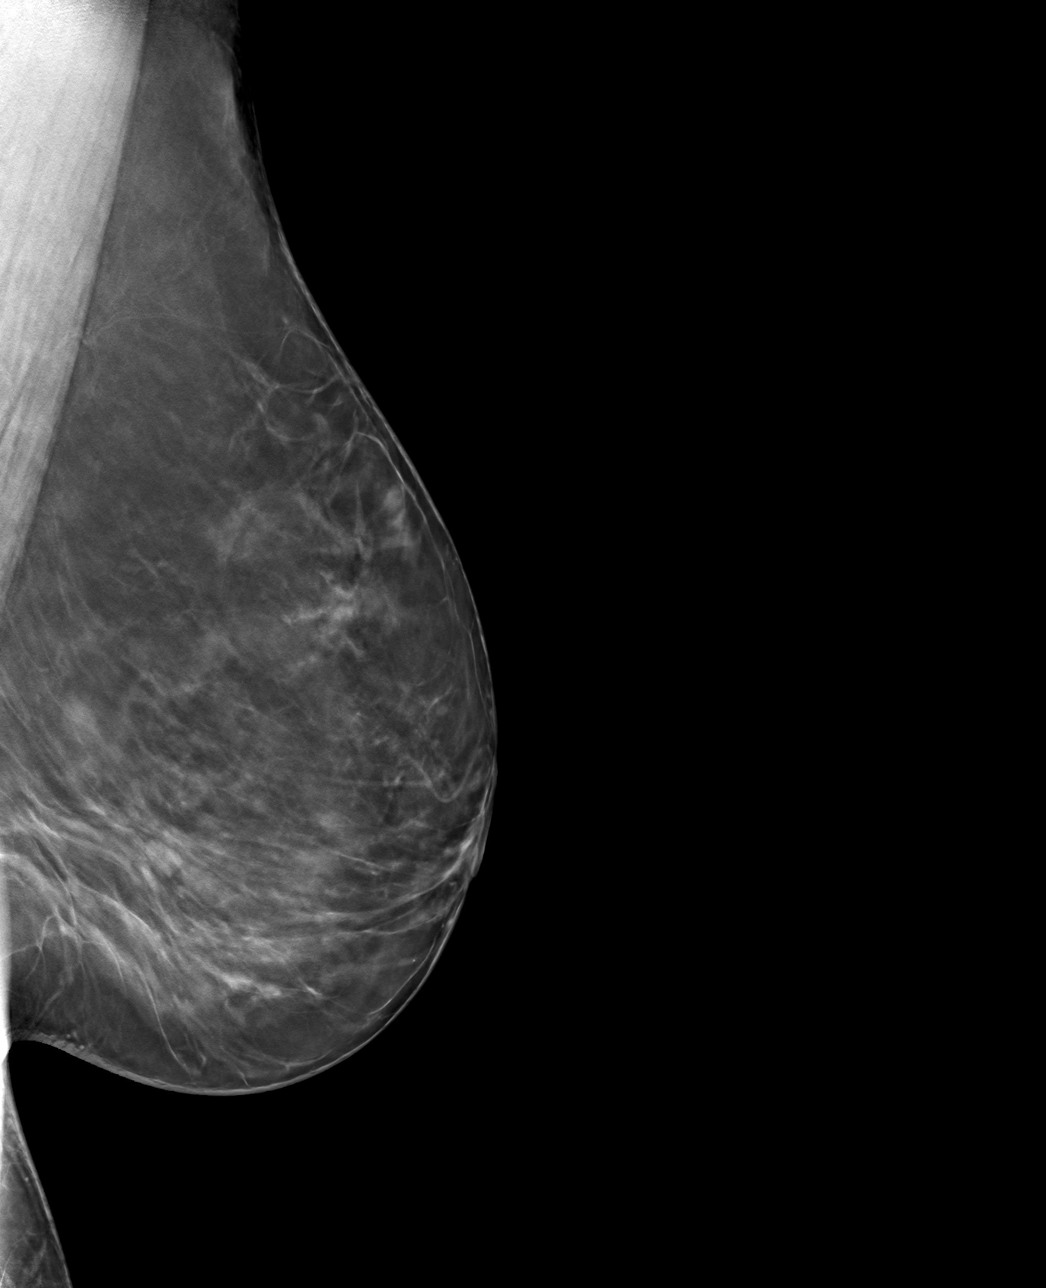

[4 of 12 positions shown; findings below may reference images not displayed]

FINDINGS: Mammographic images were obtained following ultrasound guided biopsy
of a mass in the 1 o'clock region of the left breast. There is a
ribbon shaped biopsy clip in the upper-outer quadrant of the left
breast.

Appropriate positioning of the ribbon shaped biopsy marking clip at
the site of biopsy in the upper-outer quadrant of the left breast.

Final Assessment: Post Procedure Mammograms for Marker Placement

## 2021-09-27 ENCOUNTER — Ambulatory Visit: Payer: BC Managed Care – PPO | Admitting: Internal Medicine

## 2022-01-01 ENCOUNTER — Encounter: Payer: Self-pay | Admitting: Internal Medicine

## 2022-01-01 ENCOUNTER — Ambulatory Visit: Payer: No Typology Code available for payment source | Admitting: Internal Medicine

## 2022-01-01 VITALS — BP 140/88 | HR 71 | Resp 18 | Ht 67.5 in | Wt 176.4 lb

## 2022-01-01 DIAGNOSIS — Z0000 Encounter for general adult medical examination without abnormal findings: Secondary | ICD-10-CM

## 2022-01-01 DIAGNOSIS — E782 Mixed hyperlipidemia: Secondary | ICD-10-CM

## 2022-01-01 DIAGNOSIS — J301 Allergic rhinitis due to pollen: Secondary | ICD-10-CM | POA: Diagnosis not present

## 2022-01-01 DIAGNOSIS — Z1231 Encounter for screening mammogram for malignant neoplasm of breast: Secondary | ICD-10-CM

## 2022-01-01 DIAGNOSIS — Z23 Encounter for immunization: Secondary | ICD-10-CM

## 2022-01-01 DIAGNOSIS — E1169 Type 2 diabetes mellitus with other specified complication: Secondary | ICD-10-CM | POA: Diagnosis not present

## 2022-01-01 DIAGNOSIS — E119 Type 2 diabetes mellitus without complications: Secondary | ICD-10-CM | POA: Insufficient documentation

## 2022-01-01 DIAGNOSIS — E559 Vitamin D deficiency, unspecified: Secondary | ICD-10-CM | POA: Diagnosis not present

## 2022-01-01 DIAGNOSIS — I10 Essential (primary) hypertension: Secondary | ICD-10-CM

## 2022-01-01 LAB — POCT GLYCOSYLATED HEMOGLOBIN (HGB A1C): HbA1c, POC (controlled diabetic range): 7.1 % — AB (ref 0.0–7.0)

## 2022-01-01 MED ORDER — HYDROCHLOROTHIAZIDE 12.5 MG PO CAPS: 12.5000 mg | ORAL_CAPSULE | Freq: Every day | ORAL | 1 refills | Status: DC

## 2022-01-01 MED ORDER — AMLODIPINE BESYLATE 5 MG PO TABS: 5.0000 mg | ORAL_TABLET | Freq: Every day | ORAL | 1 refills | Status: DC

## 2022-01-01 MED ORDER — ATORVASTATIN CALCIUM 10 MG PO TABS: 10.0000 mg | ORAL_TABLET | Freq: Every day | ORAL | 1 refills | Status: DC

## 2022-01-01 MED ORDER — METFORMIN HCL 500 MG PO TABS: 500.0000 mg | ORAL_TABLET | Freq: Every day | ORAL | 1 refills | Status: DC

## 2022-01-01 MED ORDER — CETIRIZINE HCL 10 MG PO TABS: 10.0000 mg | ORAL_TABLET | Freq: Every day | ORAL | 1 refills | Status: DC

## 2022-01-01 NOTE — Assessment & Plan Note (Signed)
Well-controlled with Zyrtec, refilled ?

## 2022-01-01 NOTE — Progress Notes (Signed)
? ?New Patient Office Visit ? ?Subjective:  ?Patient ID: Amber Boone, female    DOB: [birth year]  Age: 61 y.o. MRN: [id number] ? ?CC:  ?Chief Complaint  ?Patient presents with  ? New Patient (Initial Visit)  ?  New patient was seeing dr Buelah Manis just establishing care   ? ? ?HPI ?Amber Boone is a 61 y.o. female with past medical history of HTN, type II DM, HLD and allergic rhinitis who presents for establishing care. ? ?She has run out of her medications and requests refills of them. ? ?HTN: Her BP was borderline elevated today.  Of note, she was on amlodipine and HCTZ in the past.  She currently denies any headache, dizziness, chest pain, dyspnea or palpitations. ? ?Type II DM with HLD: Her HbA1c was 7.1 in the office today.  Of note, she has run out of her metformin.  She denies any fatigue, polyuria or polyphagia currently.  She has been trying to follow low-carb diet.  She was also on atorvastatin for HLD. ? ?She takes Zyrtec for allergic rhinitis.  She currently denies any dyspnea or wheezing. ? ?She received first dose of Shingrix in the office today. ? ?Past Medical History:  ?Diagnosis Date  ? Diabetes mellitus without complication ([place])   ? Hypertension   ? Left breast mass   ? ? ?Past Surgical History:  ?Procedure Laterality Date  ? BREAST CYST ASPIRATION Right 2014  ? BREAST LUMPECTOMY WITH RADIOACTIVE SEED LOCALIZATION Left 02/24/2020  ? Procedure: LEFT BREAST LUMPECTOMY WITH RADIOACTIVE SEED LOCALIZATION;  Surgeon: Erroll Luna, MD;  Location: [facility];  Service: General;  Laterality: Left;  ? TOTAL ABDOMINAL HYSTERECTOMY    ? Secondary to fibroids-No cancer  ? TUBAL LIGATION    ? ? ?Family History  ?Problem Relation Age of Onset  ? Cancer Father   ?     ? Type  ? Hypertension Mother   ? Hypertension Sister   ? Hypertension Sister   ? Hypertension Daughter   ? Hyperlipidemia Son   ? Colon cancer Neg Hx   ? ? ?Social History  ? ?Socioeconomic History  ? Marital status: Single  ?   Spouse name: Not on file  ? Number of children: Not on file  ? Years of education: Not on file  ? Highest education level: Not on file  ?Occupational History  ? Not on file  ?Tobacco Use  ? Smoking status: Never  ? Smokeless tobacco: Never  ?Substance and Sexual Activity  ? Alcohol use: No  ? Drug use: No  ? Sexual activity: Never  ?  Birth control/protection: Surgical  ?Other Topics Concern  ? Not on file  ?Social History Narrative  ? 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 Single, Lives with her mom and her brother.  ? 3 children: Ages 50,30, 63 --all out of house  ? ?Social Determinants of Health  ? ?Financial Resource Strain: Not on file  ?Food Insecurity: Not on file  ?Transportation Needs: Not on file  ?Physical Activity: Not on file  ?Stress: Not on file  ?Social Connections: Not on file  ?Intimate Partner Violence: Not on file  ? ? ?ROS ?Review of Systems  ?Constitutional:  Negative for chills and fever.  ?HENT:  Negative for congestion, sinus pressure, sinus pain and sore throat.   ?Eyes:  Negative for pain and discharge.  ?Respiratory:  Negative for cough and shortness of breath.   ?Cardiovascular:  Negative for chest pain  and palpitations.  ?Gastrointestinal:  Negative for abdominal pain, constipation, diarrhea, nausea and vomiting.  ?Endocrine: Negative for polydipsia and polyuria.  ?Genitourinary:  Negative for dysuria and hematuria.  ?Musculoskeletal:  Negative for neck pain and neck stiffness.  ?Skin:  Negative for rash.  ?Neurological:  Negative for dizziness and weakness.  ?Psychiatric/Behavioral:  Negative for agitation and behavioral problems.   ? ?Objective:  ? ?Today's Vitals: BP 140/88 (BP Location: Right Arm, Patient Position: Sitting, Cuff Size: Normal)   Pulse 71   Resp 18   Ht 5' 7.5" (1.715 m)   Wt 176 lb 6.4 oz (80 kg)   SpO2 99%   BMI 27.22 kg/m?  ? ?Physical Exam ?Vitals reviewed.  ?Constitutional:   ?   General: She is not in acute distress. ?   Appearance:  She is not diaphoretic.  ?HENT:  ?   Head: Normocephalic and atraumatic.  ?   Nose: Nose normal.  ?   Mouth/Throat:  ?   Mouth: Mucous membranes are moist.  ?Eyes:  ?   General: No scleral icterus. ?   Extraocular Movements: Extraocular movements intact.  ?Cardiovascular:  ?   Rate and Rhythm: Normal rate and regular rhythm.  ?   Pulses: Normal pulses.  ?   Heart sounds: Normal heart sounds. No murmur heard. ?Pulmonary:  ?   Breath sounds: Normal breath sounds. No wheezing or rales.  ?Musculoskeletal:  ?   Cervical back: Neck supple. No tenderness.  ?   Right lower leg: No edema.  ?   Left lower leg: No edema.  ?Skin: ?   General: Skin is warm.  ?   Findings: No rash.  ?Neurological:  ?   General: No focal deficit present.  ?   Mental Status: She is alert and oriented to person, place, and time.  ?Psychiatric:     ?   Mood and Affect: Mood normal.     ?   Behavior: Behavior normal.  ? ? ?Assessment & Plan:  ? ?Problem List Items Addressed This Visit   ? ?  ? Cardiovascular and Mediastinum  ? Hypertension  ?  BP Readings from Last 1 Encounters:  ?01/01/22 140/88  ?Uncontrolled, refilled amlodipine and HCTZ ?Counseled for compliance with the medications ?Advised DASH diet and moderate exercise/walking, at least 150 mins/week ?  ?  ? Relevant Medications  ? amLODipine (NORVASC) 5 MG tablet  ? hydrochlorothiazide (MICROZIDE) 12.5 MG capsule  ? atorvastatin (LIPITOR) 10 MG tablet  ?  ? Respiratory  ? Allergic rhinitis  ?  Well-controlled with Zyrtec, refilled ? ?  ?  ? Relevant Medications  ? cetirizine (ZYRTEC) 10 MG tablet  ?  ? Endocrine  ? Diabetes mellitus ([place]) - Primary  ?  Lab Results  ?Component Value Date  ? HGBA1C 7.1 (A) 01/01/2022  ? ?Was on metformin, refilled ?Advised to follow diabetic diet ?On statin ?F/u CMP and lipid panel ?Diabetic foot exam: Today ?Diabetic eye exam: Advised to follow up with Ophthalmology for diabetic eye exam ?  ?  ? Relevant Medications  ? atorvastatin (LIPITOR) 10 MG tablet  ?  metFORMIN (GLUCOPHAGE) 500 MG tablet  ? Other Relevant Orders  ? Microalbumin / creatinine urine ratio  ? MM 3D SCREEN BREAST BILATERAL  ? Hemoglobin A1c  ? CMP14+EGFR  ? POCT glycosylated hemoglobin (Hb A1C) (Completed)  ?  ? Other  ? Vitamin D deficiency  ? Relevant Orders  ? VITAMIN D 25 Hydroxy (Vit-D Deficiency, Fractures)  ? HLD (  hyperlipidemia)  ?  Refilled Lipitor ?Check lipid profile ? ?  ?  ? Relevant Medications  ? amLODipine (NORVASC) 5 MG tablet  ? hydrochlorothiazide (MICROZIDE) 12.5 MG capsule  ? atorvastatin (LIPITOR) 10 MG tablet  ? Other Relevant Orders  ? Lipid panel  ? ?Other Visit Diagnoses   ? ? Screening mammogram for breast cancer      ? Relevant Orders  ? MM 3D SCREEN BREAST BILATERAL  ? Immunization due      ? Relevant Orders  ? Varicella-zoster vaccine IM (Completed)  ? ?  ? ? ?Outpatient Encounter Medications as of 01/01/2022  ?Medication Sig  ? Blood Glucose Monitoring Suppl (BLOOD GLUCOSE SYSTEM PAK) KIT Please dispense based on patient and insurance preference. Use as directed to monitor FSBS 1x daily. Dx: E11.9  ? Lancets MISC Please dispense based on patient and insurance preference. Use as directed to monitor FSBS 1x daily. Dx: E11.9  ? Multiple Vitamins-Minerals (MULTIVITAMIN PO) Take by mouth.  ? ONETOUCH VERIO test strip USE AS DIRECTED TO MONITOR FSBS 1X DAILY. DX: E11.9  ? amLODipine (NORVASC) 5 MG tablet Take 1 tablet (5 mg total) by mouth daily.  ? atorvastatin (LIPITOR) 10 MG tablet Take 1 tablet (10 mg total) by mouth daily.  ? cetirizine (ZYRTEC) 10 MG tablet Take 1 tablet (10 mg total) by mouth daily.  ? Cholecalciferol 2000 units CAPS Take 1 capsule (2,000 Units total) daily by mouth. (Patient not taking: Reported on 01/01/2022)  ? hydrochlorothiazide (MICROZIDE) 12.5 MG capsule Take 1 capsule (12.5 mg total) by mouth daily.  ? metFORMIN (GLUCOPHAGE) 500 MG tablet Take 1 tablet (500 mg total) by mouth daily with breakfast.  ? [DISCONTINUED] amLODipine (NORVASC) 5 MG tablet  TAKE 1 TABLET BY MOUTH EVERY DAY (Patient not taking: Reported on 01/01/2022)  ? [DISCONTINUED] atorvastatin (LIPITOR) 10 MG tablet Take 1 tablet (10 mg total) by mouth daily. (Patient not taking: Reported on 4/

## 2022-01-01 NOTE — Assessment & Plan Note (Signed)
BP Readings from Last 1 Encounters:  ?01/01/22 140/88  ? ?Uncontrolled, refilled amlodipine and HCTZ ?Counseled for compliance with the medications ?Advised DASH diet and moderate exercise/walking, at least 150 mins/week ?

## 2022-01-01 NOTE — Assessment & Plan Note (Signed)
Lab Results  ?Component Value Date  ? HGBA1C 7.1 (A) 01/01/2022  ? ? ?Was on metformin, refilled ?Advised to follow diabetic diet ?On statin ?F/u CMP and lipid panel ?Diabetic foot exam: Today ?Diabetic eye exam: Advised to follow up with Ophthalmology for diabetic eye exam ?

## 2022-01-01 NOTE — Assessment & Plan Note (Addendum)
Refilled Lipitor ?Check lipid profile ?

## 2022-01-01 NOTE — Patient Instructions (Addendum)
Please start taking medications as prescribed. ? ?Please follow low carb diet and perform moderate exercise/walking at least 150 mins/week. ?

## 2022-01-03 LAB — MICROALBUMIN / CREATININE URINE RATIO
Creatinine, Urine: 112 mg/dL
Microalb/Creat Ratio: <3 mg/g creat (ref 0–29)
Microalbumin, Urine: <3 ug/mL

## 2022-05-02 LAB — CBC WITH DIFFERENTIAL/PLATELET
Basophils Absolute: 0 10*3/uL (ref 0.0–0.2)
Basos: 1 %
EOS (ABSOLUTE): 0 10*3/uL (ref 0.0–0.4)
Eos: 1 %
Hematocrit: 44.3 % (ref 34.0–46.6)
Hemoglobin: 14.8 g/dL (ref 11.1–15.9)
Immature Grans (Abs): 0 10*3/uL (ref 0.0–0.1)
Immature Granulocytes: 0 %
Lymphocytes Absolute: 2.2 10*3/uL (ref 0.7–3.1)
Lymphs: 38 %
MCH: 27.5 pg (ref 26.6–33.0)
MCHC: 33.4 g/dL (ref 31.5–35.7)
MCV: 82 fL (ref 79–97)
Monocytes Absolute: 0.5 10*3/uL (ref 0.1–0.9)
Monocytes: 8 %
Neutrophils Absolute: 3.1 10*3/uL (ref 1.4–7.0)
Neutrophils: 52 %
Platelets: 301 10*3/uL (ref 150–450)
RBC: 5.38 x10E6/uL — ABNORMAL HIGH (ref 3.77–5.28)
RDW: 13.5 % (ref 11.7–15.4)
WBC: 5.8 10*3/uL (ref 3.4–10.8)

## 2022-05-02 LAB — CMP14+EGFR
ALT: 17 IU/L (ref 0–32)
AST: 19 IU/L (ref 0–40)
Albumin/Globulin Ratio: 1.7 (ref 1.2–2.2)
Albumin: 4.3 g/dL (ref 3.9–4.9)
Alkaline Phosphatase: 123 IU/L — ABNORMAL HIGH (ref 44–121)
BUN/Creatinine Ratio: 12 (ref 12–28)
BUN: 12 mg/dL (ref 8–27)
Bilirubin Total: 0.2 mg/dL (ref 0.0–1.2)
CO2: 23 mmol/L (ref 20–29)
Calcium: 9.8 mg/dL (ref 8.7–10.3)
Chloride: 104 mmol/L (ref 96–106)
Creatinine, Ser: 0.98 mg/dL (ref 0.57–1.00)
Globulin, Total: 2.6 g/dL (ref 1.5–4.5)
Glucose: 119 mg/dL — ABNORMAL HIGH (ref 70–99)
Potassium: 4.7 mmol/L (ref 3.5–5.2)
Sodium: 142 mmol/L (ref 134–144)
Total Protein: 6.9 g/dL (ref 6.0–8.5)
eGFR: 66 mL/min/{1.73_m2} (ref >59)

## 2022-05-02 LAB — HEMOGLOBIN A1C
Est. average glucose Bld gHb Est-mCnc: 154 mg/dL
Hgb A1c MFr Bld: 7 % — ABNORMAL HIGH (ref 4.8–5.6)

## 2022-05-02 LAB — VITAMIN D 25 HYDROXY (VIT D DEFICIENCY, FRACTURES): Vit D, 25-Hydroxy: 40.1 ng/mL (ref 30.0–100.0)

## 2022-05-02 LAB — LIPID PANEL
Chol/HDL Ratio: 3.6 ratio (ref 0.0–4.4)
Cholesterol, Total: 218 mg/dL — ABNORMAL HIGH (ref 100–199)
HDL: 60 mg/dL (ref >39)
LDL Chol Calc (NIH): 140 mg/dL — ABNORMAL HIGH (ref 0–99)
Triglycerides: 101 mg/dL (ref 0–149)
VLDL Cholesterol Cal: 18 mg/dL (ref 5–40)

## 2022-05-02 LAB — TSH: TSH: 3.55 u[IU]/mL (ref 0.450–4.500)

## 2022-05-08 ENCOUNTER — Ambulatory Visit (INDEPENDENT_AMBULATORY_CARE_PROVIDER_SITE_OTHER): Payer: Commercial Managed Care - HMO | Admitting: Internal Medicine

## 2022-05-08 ENCOUNTER — Encounter: Payer: Self-pay | Admitting: Internal Medicine

## 2022-05-08 VITALS — BP 122/84 | HR 75 | Resp 18 | Ht 67.5 in | Wt 170.2 lb

## 2022-05-08 DIAGNOSIS — Z0001 Encounter for general adult medical examination with abnormal findings: Secondary | ICD-10-CM | POA: Diagnosis not present

## 2022-05-08 DIAGNOSIS — E1169 Type 2 diabetes mellitus with other specified complication: Secondary | ICD-10-CM

## 2022-05-08 DIAGNOSIS — E782 Mixed hyperlipidemia: Secondary | ICD-10-CM

## 2022-05-08 DIAGNOSIS — I10 Essential (primary) hypertension: Secondary | ICD-10-CM | POA: Diagnosis not present

## 2022-05-08 DIAGNOSIS — Z90710 Acquired absence of both cervix and uterus: Secondary | ICD-10-CM

## 2022-05-08 DIAGNOSIS — Z23 Encounter for immunization: Secondary | ICD-10-CM | POA: Diagnosis not present

## 2022-05-08 MED ORDER — ATORVASTATIN CALCIUM 20 MG PO TABS: 20.0000 mg | ORAL_TABLET | Freq: Every day | ORAL | 1 refills | Status: DC

## 2022-05-08 MED ORDER — HYDROCHLOROTHIAZIDE 12.5 MG PO CAPS: 12.5000 mg | ORAL_CAPSULE | Freq: Every day | ORAL | 1 refills | Status: DC

## 2022-05-08 MED ORDER — AMLODIPINE BESYLATE 5 MG PO TABS: 5.0000 mg | ORAL_TABLET | Freq: Every day | ORAL | 1 refills | Status: DC

## 2022-05-08 MED ORDER — METFORMIN HCL 500 MG PO TABS: 500.0000 mg | ORAL_TABLET | Freq: Two times a day (BID) | ORAL | 1 refills | Status: DC

## 2022-05-08 NOTE — Assessment & Plan Note (Addendum)
Lab Results  Component Value Date   HGBA1C 7.0 (H) 05/01/2022    On metformin 500 mg QD, increased to BID Advised to follow diabetic diet On statin F/u CMP and lipid panel Diabetic eye exam: Advised to follow up with Ophthalmology for diabetic eye exam

## 2022-05-08 NOTE — Progress Notes (Signed)
Established Patient Office Visit  Subjective:  Patient ID: Amber Boone, female    DOB: December 19, 1960  Age: 61 y.o. MRN: [id number]  CC:  Chief Complaint  Patient presents with   Annual Exam    Annual exam     HPI Amber Boone is a 61 y.o. female with past medical history of HTN, type II DM, HLD and allergic rhinitis who presents for annual physical.  HTN: BP is well-controlled. Takes medications regularly. Patient denies headache, dizziness, chest pain, dyspnea or palpitations.  Type II DM with HLD: Her HbA1c was 7.0. She denies any fatigue, polyuria or polyphagia currently.  She has been trying to follow low-carb diet.  She was also on atorvastatin for HLD, but was not able to refill it recently.  She takes Zyrtec for allergic rhinitis.  She currently denies any dyspnea or wheezing.   She received second dose of Shingrix and flu vaccine in the office today.     Past Medical History:  Diagnosis Date   Diabetes mellitus without complication ([place])    Hypertension    Left breast mass     Past Surgical History:  Procedure Laterality Date   BREAST CYST ASPIRATION Right 2014   BREAST LUMPECTOMY WITH RADIOACTIVE SEED LOCALIZATION Left 02/24/2020   Procedure: LEFT BREAST LUMPECTOMY WITH RADIOACTIVE SEED LOCALIZATION;  Surgeon: Erroll Luna, MD;  Location: [facility];  Service: General;  Laterality: Left;   TOTAL ABDOMINAL HYSTERECTOMY     Secondary to fibroids-No cancer   TUBAL LIGATION      Family History  Problem Relation Age of Onset   Cancer Father        ? Type   Hypertension Mother    Hypertension Sister    Hypertension Sister    Hypertension Daughter    Hyperlipidemia Son    Colon cancer Neg Hx     Social History   Socioeconomic History   Marital status: Single    Spouse name: Not on file   Number of children: Not on file   Years of education: Not on file   Highest education level: Not on file  Occupational History   Not on file   Tobacco Use   Smoking status: Never   Smokeless tobacco: Never  Substance and Sexual Activity   Alcohol use: No   Drug use: No   Sexual activity: Never    Birth control/protection: Surgical  Other Topics Concern   Not on file  Social History Narrative   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   3 children: Ages 53,30, 89 --all out of house   Social Determinants of Health   Financial Resource Strain: Not on file  Food Insecurity: Not on file  Transportation Needs: Not on file  Physical Activity: Not on file  Stress: Not on file  Social Connections: Not on file  Intimate Partner Violence: Not on file    Outpatient Medications Prior to Visit  Medication Sig Dispense Refill   Blood Glucose Monitoring Suppl (BLOOD GLUCOSE SYSTEM PAK) KIT Please dispense based on patient and insurance preference. Use as directed to monitor FSBS 1x daily. Dx: E11.9 1 kit 1   cetirizine (ZYRTEC) 10 MG tablet Take 1 tablet (10 mg total) by mouth daily. 90 tablet 1   Cholecalciferol 2000 units CAPS Take 1 capsule (2,000 Units total) daily by mouth. 30 each 3   Lancets MISC Please dispense based on patient and  insurance preference. Use as directed to monitor FSBS 1x daily. Dx: E11.9 100 each 1   Multiple Vitamins-Minerals (MULTIVITAMIN PO) Take by mouth.     ONETOUCH VERIO test strip USE AS DIRECTED TO MONITOR FSBS 1X DAILY. DX: E11.9 100 strip 1   amLODipine (NORVASC) 5 MG tablet Take 1 tablet (5 mg total) by mouth daily. 90 tablet 1   atorvastatin (LIPITOR) 10 MG tablet Take 1 tablet (10 mg total) by mouth daily. 90 tablet 1   hydrochlorothiazide (MICROZIDE) 12.5 MG capsule Take 1 capsule (12.5 mg total) by mouth daily. 90 capsule 1   metFORMIN (GLUCOPHAGE) 500 MG tablet Take 1 tablet (500 mg total) by mouth daily with breakfast. 90 tablet 1   No facility-administered medications prior to visit.    Allergies  Allergen Reactions    Benazepril     Cough     ROS Review of Systems  Constitutional:  Negative for chills and fever.  HENT:  Negative for congestion, sinus pressure, sinus pain and sore throat.   Eyes:  Negative for pain and discharge.  Respiratory:  Negative for cough and shortness of breath.   Cardiovascular:  Negative for chest pain and palpitations.  Gastrointestinal:  Negative for abdominal pain, constipation, diarrhea, nausea and vomiting.  Endocrine: Negative for polydipsia and polyuria.  Genitourinary:  Negative for dysuria and hematuria.  Musculoskeletal:  Negative for neck pain and neck stiffness.  Skin:  Negative for rash.  Neurological:  Negative for dizziness and weakness.  Psychiatric/Behavioral:  Negative for agitation and behavioral problems.       Objective:    Physical Exam Vitals reviewed.  Constitutional:      General: She is not in acute distress.    Appearance: She is not diaphoretic.  HENT:     Head: Normocephalic and atraumatic.     Nose: Nose normal.     Mouth/Throat:     Mouth: Mucous membranes are moist.  Eyes:     General: No scleral icterus.    Extraocular Movements: Extraocular movements intact.  Cardiovascular:     Rate and Rhythm: Normal rate and regular rhythm.     Pulses: Normal pulses.     Heart sounds: Normal heart sounds. No murmur heard. Pulmonary:     Breath sounds: Normal breath sounds. No wheezing or rales.  Abdominal:     Palpations: Abdomen is soft.     Tenderness: There is no abdominal tenderness.  Musculoskeletal:     Cervical back: Neck supple. No tenderness.     Right lower leg: No edema.     Left lower leg: No edema.  Skin:    General: Skin is warm.     Findings: No rash.  Neurological:     General: No focal deficit present.     Mental Status: She is alert and oriented to person, place, and time.  Psychiatric:        Mood and Affect: Mood normal.        Behavior: Behavior normal.     BP 122/84 (BP Location: Right Arm, Patient  Position: Sitting, Cuff Size: Normal)   Pulse 75   Resp 18   Ht 5' 7.5" (1.715 m)   Wt 170 lb 3.2 oz (77.2 kg)   SpO2 98%   BMI 26.26 kg/m  Wt Readings from Last 3 Encounters:  05/08/22 170 lb 3.2 oz (77.2 kg)  01/01/22 176 lb 6.4 oz (80 kg)  06/07/20 176 lb (79.8 kg)    Lab Results  Component  Value Date   TSH 3.550 05/01/2022   Lab Results  Component Value Date   WBC 5.8 05/01/2022   HGB 14.8 05/01/2022   HCT 44.3 05/01/2022   MCV 82 05/01/2022   PLT 301 05/01/2022   Lab Results  Component Value Date   NA 142 05/01/2022   K 4.7 05/01/2022   CO2 23 05/01/2022   GLUCOSE 119 (H) 05/01/2022   BUN 12 05/01/2022   CREATININE 0.98 05/01/2022   BILITOT 0.2 05/01/2022   ALKPHOS 123 (H) 05/01/2022   AST 19 05/01/2022   ALT 17 05/01/2022   PROT 6.9 05/01/2022   ALBUMIN 4.3 05/01/2022   CALCIUM 9.8 05/01/2022   ANIONGAP 9 02/21/2020   EGFR 66 05/01/2022   Lab Results  Component Value Date   CHOL 218 (H) 05/01/2022   Lab Results  Component Value Date   HDL 60 05/01/2022   Lab Results  Component Value Date   LDLCALC 140 (H) 05/01/2022   Lab Results  Component Value Date   TRIG 101 05/01/2022   Lab Results  Component Value Date   CHOLHDL 3.6 05/01/2022   Lab Results  Component Value Date   HGBA1C 7.0 (H) 05/01/2022      Assessment & Plan:   Problem List Items Addressed This Visit       Cardiovascular and Mediastinum   Hypertension    BP Readings from Last 1 Encounters:  05/08/22 122/84  Well-controlled with amlodipine and HCTZ Counseled for compliance with the medications Advised DASH diet and moderate exercise/walking, at least 150 mins/week      Relevant Medications   atorvastatin (LIPITOR) 20 MG tablet   amLODipine (NORVASC) 5 MG tablet   hydrochlorothiazide (MICROZIDE) 12.5 MG capsule     Endocrine   Diabetes mellitus ([place])    Lab Results  Component Value Date   HGBA1C 7.0 (H) 05/01/2022   On metformin 500 mg QD, increased to  BID Advised to follow diabetic diet On statin F/u CMP and lipid panel Diabetic eye exam: Advised to follow up with Ophthalmology for diabetic eye exam      Relevant Medications   atorvastatin (LIPITOR) 20 MG tablet   metFORMIN (GLUCOPHAGE) 500 MG tablet     Other   HLD (hyperlipidemia)    Increased dose of Lipitor to 20 mg QD Checked lipid profile      Relevant Medications   atorvastatin (LIPITOR) 20 MG tablet   amLODipine (NORVASC) 5 MG tablet   hydrochlorothiazide (MICROZIDE) 12.5 MG capsule   Encounter for general adult medical examination with abnormal findings - Primary    Physical exam as documented. Counseling done  re healthy lifestyle involving commitment to 150 minutes exercise per week, heart healthy diet, and attaining healthy weight.The importance of adequate sleep also discussed. Changes in health habits are decided on by the patient with goals and time frames  set for achieving them. Immunization and cancer screening needs are specifically addressed at this visit.      Other Visit Diagnoses     Need for immunization against influenza       Relevant Orders   Flu Vaccine QUAD 42moIM (Fluarix, Fluzone & Alfiuria Quad PF) (Completed)   Need for varicella vaccine       Relevant Orders   Zoster Recombinant (Shingrix ) (Completed)       Meds ordered this encounter  Medications   atorvastatin (LIPITOR) 20 MG tablet    Sig: Take 1 tablet (20 mg total) by mouth daily.  Dispense:  90 tablet    Refill:  1    Dose change   metFORMIN (GLUCOPHAGE) 500 MG tablet    Sig: Take 1 tablet (500 mg total) by mouth 2 (two) times daily with a meal.    Dispense:  180 tablet    Refill:  1   amLODipine (NORVASC) 5 MG tablet    Sig: Take 1 tablet (5 mg total) by mouth daily.    Dispense:  90 tablet    Refill:  1   hydrochlorothiazide (MICROZIDE) 12.5 MG capsule    Sig: Take 1 capsule (12.5 mg total) by mouth daily.    Dispense:  90 capsule    Refill:  1    Follow-up:  Return in about 6 months (around 11/07/2022) for DM and HLD.    Lindell Spar, MD

## 2022-05-08 NOTE — Assessment & Plan Note (Addendum)
Increased dose of Lipitor to 20 mg QD Checked lipid profile

## 2022-05-08 NOTE — Assessment & Plan Note (Signed)
BP Readings from Last 1 Encounters:  05/08/22 122/84   Well-controlled with amlodipine and HCTZ Counseled for compliance with the medications Advised DASH diet and moderate exercise/walking, at least 150 mins/week

## 2022-05-08 NOTE — Assessment & Plan Note (Signed)

## 2022-05-08 NOTE — Patient Instructions (Signed)
Please start taking Metformin 500 mg twice daily instead of once daily.  Please start taking Atorvastatin 20 mg once daily instead of 10 mg.  Please continue taking other medications as prescribed.  Please continue to follow low carb diet and perform moderate exercise/walking at least 150 mins/week.

## 2022-05-23 ENCOUNTER — Ambulatory Visit (HOSPITAL_COMMUNITY)
Admission: RE | Admit: 2022-05-23 | Discharge: 2022-05-23 | Disposition: A | Discharge status: Home / Self Care | Payer: Commercial Managed Care - HMO | Source: Ambulatory Visit | Attending: Internal Medicine | Admitting: Internal Medicine

## 2022-05-23 DIAGNOSIS — Z1231 Encounter for screening mammogram for malignant neoplasm of breast: Secondary | ICD-10-CM | POA: Insufficient documentation

## 2022-05-23 DIAGNOSIS — E1169 Type 2 diabetes mellitus with other specified complication: Secondary | ICD-10-CM | POA: Insufficient documentation

## 2022-05-27 ENCOUNTER — Other Ambulatory Visit: Payer: Self-pay | Admitting: *Deleted

## 2022-05-27 DIAGNOSIS — E782 Mixed hyperlipidemia: Secondary | ICD-10-CM

## 2022-05-27 DIAGNOSIS — E1169 Type 2 diabetes mellitus with other specified complication: Secondary | ICD-10-CM

## 2022-05-27 DIAGNOSIS — I10 Essential (primary) hypertension: Secondary | ICD-10-CM

## 2022-05-27 MED ORDER — HYDROCHLOROTHIAZIDE 12.5 MG PO CAPS: 12.5000 mg | ORAL_CAPSULE | Freq: Every day | ORAL | 1 refills | Status: DC

## 2022-05-27 MED ORDER — METFORMIN HCL 500 MG PO TABS: 500.0000 mg | ORAL_TABLET | Freq: Two times a day (BID) | ORAL | 1 refills | Status: DC

## 2022-05-27 MED ORDER — ATORVASTATIN CALCIUM 20 MG PO TABS: 20.0000 mg | ORAL_TABLET | Freq: Every day | ORAL | 1 refills | Status: DC

## 2022-05-27 MED ORDER — AMLODIPINE BESYLATE 5 MG PO TABS: 5.0000 mg | ORAL_TABLET | Freq: Every day | ORAL | 1 refills | Status: DC

## 2022-06-04 ENCOUNTER — Other Ambulatory Visit: Payer: Self-pay | Admitting: Internal Medicine

## 2022-06-04 DIAGNOSIS — I10 Essential (primary) hypertension: Secondary | ICD-10-CM

## 2022-06-04 MED ORDER — AMLODIPINE BESYLATE 5 MG PO TABS: 5.0000 mg | ORAL_TABLET | Freq: Every day | ORAL | 3 refills | Status: DC

## 2022-08-09 ENCOUNTER — Other Ambulatory Visit: Payer: Self-pay | Admitting: Internal Medicine

## 2022-08-09 DIAGNOSIS — J301 Allergic rhinitis due to pollen: Secondary | ICD-10-CM

## 2022-08-31 ENCOUNTER — Other Ambulatory Visit: Payer: Self-pay | Admitting: Internal Medicine

## 2022-08-31 DIAGNOSIS — E1169 Type 2 diabetes mellitus with other specified complication: Secondary | ICD-10-CM

## 2022-09-11 ENCOUNTER — Ambulatory Visit (INDEPENDENT_AMBULATORY_CARE_PROVIDER_SITE_OTHER): Payer: Self-pay | Admitting: Internal Medicine

## 2022-09-11 ENCOUNTER — Encounter: Payer: Self-pay | Admitting: Internal Medicine

## 2022-09-11 VITALS — BP 136/82 | HR 77 | Ht 67.5 in | Wt 173.0 lb

## 2022-09-11 DIAGNOSIS — Z20822 Contact with and (suspected) exposure to covid-19: Secondary | ICD-10-CM | POA: Insufficient documentation

## 2022-09-11 DIAGNOSIS — I10 Essential (primary) hypertension: Secondary | ICD-10-CM

## 2022-09-11 MED ORDER — HYDROCHLOROTHIAZIDE 12.5 MG PO CAPS: 12.5000 mg | ORAL_CAPSULE | Freq: Every day | ORAL | 1 refills | Status: DC

## 2022-09-11 NOTE — Assessment & Plan Note (Addendum)
Check COVID RT-PCR Asymptomatic currently Advised to maintain adequate hydration

## 2022-09-11 NOTE — Assessment & Plan Note (Addendum)
BP Readings from Last 1 Encounters:  09/11/22 136/82   Well-controlled with amlodipine and HCTZ, refilled Counseled for compliance with the medications Advised DASH diet and moderate exercise/walking, at least 150 mins/week

## 2022-09-11 NOTE — Progress Notes (Signed)
Acute Office Visit  Subjective:    Patient ID: Amber Boone, female    DOB: Feb 28, 1961, 62 y.o.   MRN: [id number]  Chief Complaint  Patient presents with   Covid Exposure    Patient states she has been exposed to covid would like to be tested but has not had any symptoms of covid    HPI Patient is in today for concern for recent COVID positive  exposure.  Her partner's mother was recently at admitted in the hospital for COVID-19 infection.  She was in close contact with her until 3 days ago.  She denies any fever or chills.  Denies any nasal congestion, sinus pressure, cough, dyspnea or wheezing currently.  Her BP was initially elevated.  Of note, she has run out of her HCTZ.  She denies any headache, dizziness, chest pain, dyspnea or palpitations.  Past Medical History:  Diagnosis Date   Diabetes mellitus without complication ([place])    Hypertension    Left breast mass     Past Surgical History:  Procedure Laterality Date   BREAST CYST ASPIRATION Right 2014   BREAST LUMPECTOMY WITH RADIOACTIVE SEED LOCALIZATION Left 02/24/2020   Procedure: LEFT BREAST LUMPECTOMY WITH RADIOACTIVE SEED LOCALIZATION;  Surgeon: Erroll Luna, MD;  Location: [facility];  Service: General;  Laterality: Left;   TOTAL ABDOMINAL HYSTERECTOMY     Secondary to fibroids-No cancer   TUBAL LIGATION      Family History  Problem Relation Age of Onset   Cancer Father        ? Type   Hypertension Mother    Hypertension Sister    Hypertension Sister    Hypertension Daughter    Hyperlipidemia Son    Colon cancer Neg Hx     Social History   Socioeconomic History   Marital status: Single    Spouse name: Not on file   Number of children: Not on file   Years of education: Not on file   Highest education level: Not on file  Occupational History   Not on file  Tobacco Use   Smoking status: Never   Smokeless tobacco: Never  Substance and Sexual Activity   Alcohol use: No   Drug  use: No   Sexual activity: Never    Birth control/protection: Surgical  Other Topics Concern   Not on file  Social History Narrative   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   3 children: Ages 47,30, 31 --all out of house   Social Determinants of Health   Financial Resource Strain: Not on file  Food Insecurity: Not on file  Transportation Needs: Not on file  Physical Activity: Not on file  Stress: Not on file  Social Connections: Not on file  Intimate Partner Violence: Not on file    Outpatient Medications Prior to Visit  Medication Sig Dispense Refill   amLODipine (NORVASC) 5 MG tablet Take 1 tablet (5 mg total) by mouth daily. 90 tablet 3   atorvastatin (LIPITOR) 20 MG tablet Take 1 tablet (20 mg total) by mouth daily. 90 tablet 1   Blood Glucose Monitoring Suppl (BLOOD GLUCOSE SYSTEM PAK) KIT Please dispense based on patient and insurance preference. Use as directed to monitor FSBS 1x daily. Dx: E11.9 1 kit 1   cetirizine (ZYRTEC) 10 MG tablet TAKE 1 TABLET BY MOUTH EVERY DAY 90 tablet 1   Cholecalciferol 2000 units CAPS Take 1 capsule (  2,000 Units total) daily by mouth. 30 each 3   Lancets MISC Please dispense based on patient and insurance preference. Use as directed to monitor FSBS 1x daily. Dx: E11.9 100 each 1   metFORMIN (GLUCOPHAGE) 500 MG tablet TAKE 1 TABLET BY MOUTH EVERY DAY WITH BREAKFAST 90 tablet 1   Multiple Vitamins-Minerals (MULTIVITAMIN PO) Take by mouth.     ONETOUCH VERIO test strip USE AS DIRECTED TO MONITOR FSBS 1X DAILY. DX: E11.9 100 strip 1   hydrochlorothiazide (MICROZIDE) 12.5 MG capsule Take 1 capsule (12.5 mg total) by mouth daily. 90 capsule 1   No facility-administered medications prior to visit.    Allergies  Allergen Reactions   Benazepril     Cough     Review of Systems  Constitutional:  Negative for chills and fever.  HENT:  Negative for congestion, sinus pressure,  sinus pain and sore throat.   Eyes:  Negative for pain and discharge.  Respiratory:  Negative for cough and shortness of breath.   Cardiovascular:  Negative for chest pain and palpitations.  Gastrointestinal:  Negative for abdominal pain, constipation, diarrhea, nausea and vomiting.  Endocrine: Negative for polydipsia and polyuria.  Genitourinary:  Negative for dysuria and hematuria.  Musculoskeletal:  Negative for neck pain and neck stiffness.  Skin:  Negative for rash.  Neurological:  Negative for dizziness and weakness.  Psychiatric/Behavioral:  Negative for agitation and behavioral problems.        Objective:    Physical Exam Vitals reviewed.  Constitutional:      General: She is not in acute distress.    Appearance: She is not diaphoretic.  HENT:     Head: Normocephalic and atraumatic.     Nose: Nose normal.     Mouth/Throat:     Mouth: Mucous membranes are moist.  Eyes:     General: No scleral icterus.    Extraocular Movements: Extraocular movements intact.  Cardiovascular:     Rate and Rhythm: Normal rate and regular rhythm.     Pulses: Normal pulses.     Heart sounds: Normal heart sounds. No murmur heard. Pulmonary:     Breath sounds: Normal breath sounds. No wheezing or rales.  Musculoskeletal:     Cervical back: Neck supple. No tenderness.     Right lower leg: No edema.     Left lower leg: No edema.  Skin:    General: Skin is warm.     Findings: No rash.  Neurological:     General: No focal deficit present.     Mental Status: She is alert and oriented to person, place, and time.  Psychiatric:        Mood and Affect: Mood normal.        Behavior: Behavior normal.     BP 136/82 (BP Location: Left Arm, Cuff Size: Normal)   Pulse 77   Ht 5' 7.5" (1.715 m)   Wt 173 lb (78.5 kg)   SpO2 95%   BMI 26.70 kg/m  Wt Readings from Last 3 Encounters:  09/11/22 173 lb (78.5 kg)  05/08/22 170 lb 3.2 oz (77.2 kg)  01/01/22 176 lb 6.4 oz (80 kg)         Assessment & Plan:   Problem List Items Addressed This Visit       Cardiovascular and Mediastinum   Hypertension    BP Readings from Last 1 Encounters:  09/11/22 136/82  Well-controlled with amlodipine and HCTZ, refilled Counseled for compliance with the medications Advised DASH  diet and moderate exercise/walking, at least 150 mins/week      Relevant Medications   hydrochlorothiazide (MICROZIDE) 12.5 MG capsule     Other   Exposure to COVID-19 virus - Primary    Check COVID RT-PCR Asymptomatic currently Advised to maintain adequate hydration      Relevant Orders   Novel Coronavirus, NAA ([facility])     Meds ordered this encounter  Medications   hydrochlorothiazide (MICROZIDE) 12.5 MG capsule    Sig: Take 1 capsule (12.5 mg total) by mouth daily.    Dispense:  90 capsule    Refill:  1     Sarabella Caprio Keith Rake, MD

## 2022-09-11 NOTE — Patient Instructions (Signed)
Please start taking HCTZ as prescribed.  Please wear mask in public places. Perform frequent handwashing.

## 2022-09-13 LAB — NOVEL CORONAVIRUS, NAA: SARS-CoV-2, NAA: NOT DETECTED

## 2022-10-25 ENCOUNTER — Other Ambulatory Visit: Payer: Self-pay | Admitting: Internal Medicine

## 2022-10-25 DIAGNOSIS — E782 Mixed hyperlipidemia: Secondary | ICD-10-CM

## 2022-11-07 ENCOUNTER — Ambulatory Visit (INDEPENDENT_AMBULATORY_CARE_PROVIDER_SITE_OTHER): Payer: BLUE CROSS/BLUE SHIELD | Admitting: Internal Medicine

## 2022-11-07 ENCOUNTER — Encounter: Payer: Self-pay | Admitting: Internal Medicine

## 2022-11-07 VITALS — BP 137/58 | HR 88 | Ht 67.5 in | Wt 175.4 lb

## 2022-11-07 DIAGNOSIS — I10 Essential (primary) hypertension: Secondary | ICD-10-CM | POA: Diagnosis not present

## 2022-11-07 DIAGNOSIS — E559 Vitamin D deficiency, unspecified: Secondary | ICD-10-CM | POA: Diagnosis not present

## 2022-11-07 DIAGNOSIS — E1169 Type 2 diabetes mellitus with other specified complication: Secondary | ICD-10-CM

## 2022-11-07 DIAGNOSIS — E782 Mixed hyperlipidemia: Secondary | ICD-10-CM

## 2022-11-07 MED ORDER — BLOOD GLUCOSE MONITOR KIT: PACK | 0 refills | Status: AC

## 2022-11-07 NOTE — Assessment & Plan Note (Signed)
BP Readings from Last 1 Encounters:  11/07/22 (!) 137/58   Well-controlled with amlodipine and HCTZ, refilled Counseled for compliance with the medications Advised DASH diet and moderate exercise/walking, at least 150 mins/week

## 2022-11-07 NOTE — Assessment & Plan Note (Signed)
On Lipitor to 20 mg QD Check lipid profile

## 2022-11-07 NOTE — Patient Instructions (Signed)
Please continue to take medications as prescribed. ? ?Please continue to follow low carb diet and perform moderate exercise/walking at least 150 mins/week. ?

## 2022-11-07 NOTE — Progress Notes (Addendum)
New Patient Office Visit  Subjective:  Patient ID: Amber Boone, female    DOB: 24-Feb-1961  Age: 62 y.o. MRN: [id number]  CC:  Chief Complaint  Patient presents with   Diabetes    Six month follow up on diabetes and HLD    HPI Amber Boone is a 62 y.o. female with past medical history of HTN, type II DM, HLD and allergic rhinitis who presents for establishing care.  HTN: BP is well-controlled. Takes medications regularly. Patient denies headache, dizziness, chest pain, dyspnea or palpitations.   Type II DM with HLD: Her HbA1c was 7.0 in 08/23. She takes Metformin 500 mg BID. She denies any fatigue, polyuria or polyphagia currently.  She has been trying to follow low-carb diet.  She is also on atorvastatin for HLD.  She takes Zyrtec for allergic rhinitis.  She currently denies any dyspnea or wheezing.  Past Medical History:  Diagnosis Date   Diabetes mellitus without complication ([facility])    Hypertension    Left breast mass     Past Surgical History:  Procedure Laterality Date   BREAST CYST ASPIRATION Right 2014   BREAST LUMPECTOMY WITH RADIOACTIVE SEED LOCALIZATION Left 02/24/2020   Procedure: LEFT BREAST LUMPECTOMY WITH RADIOACTIVE SEED LOCALIZATION;  Surgeon: Harriette Bouillon, MD;  Location: [facility];  Service: General;  Laterality: Left;   TOTAL ABDOMINAL HYSTERECTOMY     Secondary to fibroids-No cancer   TUBAL LIGATION      Family History  Problem Relation Age of Onset   Cancer Father        ? Type   Hypertension Mother    Hypertension Sister    Hypertension Sister    Hypertension Daughter    Hyperlipidemia Son    Colon cancer Neg Hx     Social History   Socioeconomic History   Marital status: Single    Spouse name: Not on file   Number of children: Not on file   Years of education: Not on file   Highest education level: Not on file  Occupational History   Not on file  Tobacco Use   Smoking status: Never   Smokeless tobacco: Never   Substance and Sexual Activity   Alcohol use: No   Drug use: No   Sexual activity: Never    Birth control/protection: Surgical  Other Topics Concern   Not on file  Social History Narrative   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   3 children: Ages 2,30, 65 --all out of house   Social Determinants of Health   Financial Resource Strain: Not on file  Food Insecurity: Not on file  Transportation Needs: Not on file  Physical Activity: Not on file  Stress: Not on file  Social Connections: Not on file  Intimate Partner Violence: Not on file    ROS Review of Systems  Constitutional:  Negative for chills and fever.  HENT:  Negative for congestion, sinus pressure, sinus pain and sore throat.   Eyes:  Negative for pain and discharge.  Respiratory:  Negative for cough and shortness of breath.   Cardiovascular:  Negative for chest pain and palpitations.  Gastrointestinal:  Negative for abdominal pain, constipation, diarrhea, nausea and vomiting.  Endocrine: Negative for polydipsia and polyuria.  Genitourinary:  Negative for dysuria and hematuria.  Musculoskeletal:  Negative for neck pain and neck stiffness.  Skin:  Negative for rash.  Neurological:  Negative for  dizziness and weakness.  Psychiatric/Behavioral:  Negative for agitation and behavioral problems.     Objective:   Today's Vitals: BP (!) 137/58 (BP Location: Right Arm, Patient Position: Sitting, Cuff Size: Normal)   Pulse 88   Ht 5' 7.5" (1.715 m)   Wt 175 lb 6.4 oz (79.6 kg)   SpO2 95%   BMI 27.07 kg/m   Physical Exam Vitals reviewed.  Constitutional:      General: She is not in acute distress.    Appearance: She is not diaphoretic.  HENT:     Head: Normocephalic and atraumatic.     Nose: Nose normal.     Mouth/Throat:     Mouth: Mucous membranes are moist.  Eyes:     General: No scleral icterus.    Extraocular Movements: Extraocular  movements intact.  Cardiovascular:     Rate and Rhythm: Normal rate and regular rhythm.     Pulses: Normal pulses.     Heart sounds: Normal heart sounds. No murmur heard. Pulmonary:     Breath sounds: Normal breath sounds. No wheezing or rales.  Musculoskeletal:     Cervical back: Neck supple. No tenderness.     Right lower leg: No edema.     Left lower leg: No edema.  Skin:    General: Skin is warm.     Findings: No rash.  Neurological:     General: No focal deficit present.     Mental Status: She is alert and oriented to person, place, and time.  Psychiatric:        Mood and Affect: Mood normal.        Behavior: Behavior normal.     Assessment & Plan:   Problem List Items Addressed This Visit    Hypertension BP Readings from Last 1 Encounters:  11/07/22 (!) 137/58   Well-controlled with amlodipine and HCTZ, refilled Counseled for compliance with the medications Advised DASH diet and moderate exercise/walking, at least 150 mins/week  Diabetes mellitus ([facility]) Lab Results  Component Value Date   HGBA1C 7.0 (H) 05/01/2022   Associated with HTN and HLD Well-controlled On metformin 500 mg BID Advised to follow diabetic diet On statin F/u CMP and lipid panel Diabetic eye exam: Advised to follow up with Ophthalmology for diabetic eye exam  HLD (hyperlipidemia) On Lipitor to 20 mg QD Check lipid profile  Orders Placed This Encounter  Procedures   CMP14+EGFR   Hemoglobin A1c   Lipid Profile      Outpatient Encounter Medications as of 11/07/2022  Medication Sig   blood glucose meter kit and supplies KIT Dispense based on patient and insurance preference. Use up to four times daily as directed.   amLODipine (NORVASC) 5 MG tablet Take 1 tablet (5 mg total) by mouth daily.   cetirizine (ZYRTEC) 10 MG tablet TAKE 1 TABLET BY MOUTH EVERY DAY   Cholecalciferol 2000 units CAPS Take 1 capsule (2,000 Units total) daily by mouth.   hydrochlorothiazide (MICROZIDE) 12.5 MG  capsule Take 1 capsule (12.5 mg total) by mouth daily.   Lancets MISC Please dispense based on patient and insurance preference. Use as directed to monitor FSBS 1x daily. Dx: E11.9   Multiple Vitamins-Minerals (MULTIVITAMIN PO) Take by mouth.   [DISCONTINUED] atorvastatin (LIPITOR) 20 MG tablet TAKE 1 TABLET BY MOUTH EVERY DAY   [DISCONTINUED] Blood Glucose Monitoring Suppl (BLOOD GLUCOSE SYSTEM PAK) KIT Please dispense based on patient and insurance preference. Use as directed to monitor FSBS 1x daily. Dx: E11.9   [  DISCONTINUED] metFORMIN (GLUCOPHAGE) 500 MG tablet TAKE 1 TABLET BY MOUTH EVERY DAY WITH BREAKFAST   [DISCONTINUED] ONETOUCH VERIO test strip USE AS DIRECTED TO MONITOR FSBS 1X DAILY. DX: E11.9   No facility-administered encounter medications on file as of 11/07/2022.    Follow-up: Return in about 6 months (around 05/08/2023) for Annual physical.   Anabel Halon, MD

## 2022-11-07 NOTE — Assessment & Plan Note (Signed)
Lab Results  Component Value Date   HGBA1C 7.0 (H) 05/01/2022   Associated with HTN and HLD Well-controlled On metformin 500 mg BID Advised to follow diabetic diet On statin F/u CMP and lipid panel Diabetic eye exam: Advised to follow up with Ophthalmology for diabetic eye exam

## 2022-11-08 LAB — HEMOGLOBIN A1C
Est. average glucose Bld gHb Est-mCnc: 160 mg/dL
Hgb A1c MFr Bld: 7.2 % — ABNORMAL HIGH (ref 4.8–5.6)

## 2022-11-08 LAB — CMP14+EGFR
ALT: 38 IU/L — ABNORMAL HIGH (ref 0–32)
AST: 28 IU/L (ref 0–40)
Albumin/Globulin Ratio: 1.5 (ref 1.2–2.2)
Albumin: 4.3 g/dL (ref 3.9–4.9)
Alkaline Phosphatase: 126 IU/L — ABNORMAL HIGH (ref 44–121)
BUN/Creatinine Ratio: 16 (ref 12–28)
BUN: 15 mg/dL (ref 8–27)
Bilirubin Total: 0.3 mg/dL (ref 0.0–1.2)
CO2: 24 mmol/L (ref 20–29)
Calcium: 9.8 mg/dL (ref 8.7–10.3)
Chloride: 104 mmol/L (ref 96–106)
Creatinine, Ser: 0.93 mg/dL (ref 0.57–1.00)
Globulin, Total: 2.8 g/dL (ref 1.5–4.5)
Glucose: 108 mg/dL — ABNORMAL HIGH (ref 70–99)
Potassium: 3.8 mmol/L (ref 3.5–5.2)
Sodium: 141 mmol/L (ref 134–144)
Total Protein: 7.1 g/dL (ref 6.0–8.5)
eGFR: 70 mL/min/{1.73_m2} (ref >59)

## 2022-11-08 LAB — LIPID PANEL
Chol/HDL Ratio: 3.3 ratio (ref 0.0–4.4)
Cholesterol, Total: 178 mg/dL (ref 100–199)
HDL: 54 mg/dL (ref >39)
LDL Chol Calc (NIH): 95 mg/dL (ref 0–99)
Triglycerides: 171 mg/dL — ABNORMAL HIGH (ref 0–149)
VLDL Cholesterol Cal: 29 mg/dL (ref 5–40)

## 2023-01-26 ENCOUNTER — Other Ambulatory Visit: Payer: Self-pay | Admitting: Internal Medicine

## 2023-01-26 DIAGNOSIS — E1169 Type 2 diabetes mellitus with other specified complication: Secondary | ICD-10-CM

## 2023-04-29 ENCOUNTER — Other Ambulatory Visit: Payer: Self-pay | Admitting: Internal Medicine

## 2023-04-29 DIAGNOSIS — E1169 Type 2 diabetes mellitus with other specified complication: Secondary | ICD-10-CM

## 2023-05-05 ENCOUNTER — Other Ambulatory Visit: Payer: Self-pay | Admitting: Internal Medicine

## 2023-05-05 DIAGNOSIS — E782 Mixed hyperlipidemia: Secondary | ICD-10-CM

## 2023-05-06 ENCOUNTER — Ambulatory Visit (INDEPENDENT_AMBULATORY_CARE_PROVIDER_SITE_OTHER): Payer: BLUE CROSS/BLUE SHIELD | Admitting: Internal Medicine

## 2023-05-06 ENCOUNTER — Encounter: Payer: Self-pay | Admitting: Internal Medicine

## 2023-05-06 VITALS — BP 144/76 | HR 101 | Ht 67.5 in | Wt 176.6 lb

## 2023-05-06 DIAGNOSIS — Z23 Encounter for immunization: Secondary | ICD-10-CM

## 2023-05-06 DIAGNOSIS — I10 Essential (primary) hypertension: Secondary | ICD-10-CM

## 2023-05-06 DIAGNOSIS — Z7984 Long term (current) use of oral hypoglycemic drugs: Secondary | ICD-10-CM

## 2023-05-06 DIAGNOSIS — Z0001 Encounter for general adult medical examination with abnormal findings: Secondary | ICD-10-CM | POA: Diagnosis not present

## 2023-05-06 DIAGNOSIS — E1169 Type 2 diabetes mellitus with other specified complication: Secondary | ICD-10-CM | POA: Diagnosis not present

## 2023-05-06 DIAGNOSIS — E559 Vitamin D deficiency, unspecified: Secondary | ICD-10-CM

## 2023-05-06 DIAGNOSIS — E782 Mixed hyperlipidemia: Secondary | ICD-10-CM

## 2023-05-06 MED ORDER — OLMESARTAN MEDOXOMIL-HCTZ 20-12.5 MG PO TABS: 1.0000 | ORAL_TABLET | Freq: Every day | ORAL | 2 refills | Status: DC

## 2023-05-06 MED ORDER — AMLODIPINE BESYLATE 5 MG PO TABS: 5.0000 mg | ORAL_TABLET | Freq: Every day | ORAL | 3 refills | Status: DC

## 2023-05-06 MED ORDER — METFORMIN HCL 500 MG PO TABS: 500.0000 mg | ORAL_TABLET | Freq: Two times a day (BID) | ORAL | 1 refills | Status: DC

## 2023-05-06 NOTE — Assessment & Plan Note (Addendum)
BP Readings from Last 1 Encounters:  05/06/23 (!) 144/76   Uncontrolled with amlodipine and hydrochlorothiazide Started Olmesartan-hydrochlorothiazide 20-12.5 mg instead of hydrochlorothiazide Counseled for compliance with the medications Advised DASH diet and moderate exercise/walking, at least 150 mins/week

## 2023-05-06 NOTE — Assessment & Plan Note (Signed)
Lab Results  Component Value Date   HGBA1C 7.2 (H) 11/07/2022   Associated with HTN and HLD Well-controlled overall On metformin 500 mg BID Advised to follow diabetic diet On statin F/u CMP and lipid panel Diabetic eye exam: Advised to follow up with Ophthalmology for diabetic eye exam

## 2023-05-06 NOTE — Assessment & Plan Note (Signed)

## 2023-05-06 NOTE — Assessment & Plan Note (Signed)
On Lipitor to 20 mg QD Check lipid profile

## 2023-05-06 NOTE — Patient Instructions (Addendum)
Please start taking Olmesartan-hydrochlorothiazide 20-12.5 mg once daily instead of hydrochlorothiazide 12.5 mg.  Please continue to take other medications as prescribed.  Please continue to follow low carb diet and perform moderate exercise/walking at least 150 mins/week.  Please get fasting blood tests done after 2 weeks.  Please contact [facility] at [phone] for colonoscopy.

## 2023-05-06 NOTE — Progress Notes (Signed)
Established Patient Office Visit  Subjective:  Patient ID: Amber Boone, female    DOB: September 07, 1961  Age: 62 y.o. MRN: [id number]  CC:  Chief Complaint  Patient presents with   Annual Exam    HPI Amber Boone is a 61 y.o. female with past medical history of HTN, type II DM, HLD and allergic rhinitis who presents for annual physical.  HTN: BP is well-controlled. Takes medications regularly. Patient denies headache, dizziness, chest pain, dyspnea or palpitations.  Type II DM with HLD: Her HbA1c was 7.2 in 02/24. She has been taking Metformin 500 mg BID now. She denies any fatigue, polyuria or polyphagia currently.  She has been trying to follow low-carb diet.  She is also on atorvastatin for HLD.  She takes Zyrtec for allergic rhinitis.  She currently denies any dyspnea or wheezing.   She received flu vaccine in the office today.     Past Medical History:  Diagnosis Date   Diabetes mellitus without complication (HCC)    Hypertension    Left breast mass     Past Surgical History:  Procedure Laterality Date   BREAST CYST ASPIRATION Right 2014   BREAST LUMPECTOMY WITH RADIOACTIVE SEED LOCALIZATION Left 02/24/2020   Procedure: LEFT BREAST LUMPECTOMY WITH RADIOACTIVE SEED LOCALIZATION;  Surgeon: Harriette Bouillon, MD;  Location: [facility];  Service: General;  Laterality: Left;   TOTAL ABDOMINAL HYSTERECTOMY     Secondary to fibroids-No cancer   TUBAL LIGATION      Family History  Problem Relation Age of Onset   Cancer Father        ? Type   Hypertension Mother    Hypertension Sister    Hypertension Sister    Hypertension Daughter    Hyperlipidemia Son    Colon cancer Neg Hx     Social History   Socioeconomic History   Marital status: Single    Spouse name: Not on file   Number of children: Not on file   Years of education: Not on file   Highest education level: Not on file  Occupational History   Not on file  Tobacco Use   Smoking status:  Never   Smokeless tobacco: Never  Substance and Sexual Activity   Alcohol use: No   Drug use: No   Sexual activity: Never    Birth control/protection: Surgical  Other Topics Concern   Not on file  Social History Narrative   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   3 children: Ages 58,30, 2 --all out of house   Social Determinants of Health   Financial Resource Strain: Not on file  Food Insecurity: Not on file  Transportation Needs: Not on file  Physical Activity: Not on file  Stress: Not on file  Social Connections: Not on file  Intimate Partner Violence: Not on file    Outpatient Medications Prior to Visit  Medication Sig Dispense Refill   atorvastatin (LIPITOR) 20 MG tablet TAKE 1 TABLET BY MOUTH EVERY DAY 90 tablet 2   blood glucose meter kit and supplies KIT Dispense based on patient and insurance preference. Use up to four times daily as directed. 1 each 0   cetirizine (ZYRTEC) 10 MG tablet TAKE 1 TABLET BY MOUTH EVERY DAY 90 tablet 1   Cholecalciferol 2000 units CAPS Take 1 capsule (2,000 Units total) daily by mouth. 30 each 3   Lancets MISC Please dispense based on  patient and insurance preference. Use as directed to monitor FSBS 1x daily. Dx: E11.9 100 each 1   Multiple Vitamins-Minerals (MULTIVITAMIN PO) Take by mouth.     amLODipine (NORVASC) 5 MG tablet Take 1 tablet (5 mg total) by mouth daily. 90 tablet 3   hydrochlorothiazide (MICROZIDE) 12.5 MG capsule Take 1 capsule (12.5 mg total) by mouth daily. 90 capsule 1   metFORMIN (GLUCOPHAGE) 500 MG tablet TAKE 1 TABLET BY MOUTH EVERY DAY WITH BREAKFAST 90 tablet 1   No facility-administered medications prior to visit.    Allergies  Allergen Reactions   Benazepril     Cough     ROS Review of Systems  Constitutional:  Negative for chills and fever.  HENT:  Negative for congestion, sinus pressure, sinus pain and sore throat.   Eyes:  Negative  for pain and discharge.  Respiratory:  Negative for cough and shortness of breath.   Cardiovascular:  Negative for chest pain and palpitations.  Gastrointestinal:  Negative for abdominal pain, constipation, diarrhea, nausea and vomiting.  Endocrine: Negative for polydipsia and polyuria.  Genitourinary:  Negative for dysuria and hematuria.  Musculoskeletal:  Negative for neck pain and neck stiffness.  Skin:  Negative for rash.  Neurological:  Negative for dizziness and weakness.  Psychiatric/Behavioral:  Negative for agitation and behavioral problems.       Objective:    Physical Exam Vitals reviewed.  Constitutional:      General: She is not in acute distress.    Appearance: She is not diaphoretic.  HENT:     Head: Normocephalic and atraumatic.     Nose: Nose normal.     Mouth/Throat:     Mouth: Mucous membranes are moist.  Eyes:     General: No scleral icterus.    Extraocular Movements: Extraocular movements intact.  Cardiovascular:     Rate and Rhythm: Normal rate and regular rhythm.     Pulses: Normal pulses.     Heart sounds: Normal heart sounds. No murmur heard. Pulmonary:     Breath sounds: Normal breath sounds. No wheezing or rales.  Abdominal:     Palpations: Abdomen is soft.     Tenderness: There is no abdominal tenderness.  Musculoskeletal:     Cervical back: Neck supple. No tenderness.     Right lower leg: No edema.     Left lower leg: No edema.  Skin:    General: Skin is warm.     Findings: No rash.  Neurological:     General: No focal deficit present.     Mental Status: She is alert and oriented to person, place, and time.     Cranial Nerves: No cranial nerve deficit.     Sensory: No sensory deficit.     Motor: No weakness.  Psychiatric:        Mood and Affect: Mood normal.        Behavior: Behavior normal.     BP (!) 144/76 (BP Location: Left Arm)   Pulse (!) 101   Ht 5' 7.5" (1.715 m)   Wt 176 lb 9.6 oz (80.1 kg)   SpO2 97%   BMI 27.25 kg/m   Wt Readings from Last 3 Encounters:  05/06/23 176 lb 9.6 oz (80.1 kg)  11/07/22 175 lb 6.4 oz (79.6 kg)  09/11/22 173 lb (78.5 kg)    Lab Results  Component Value Date   TSH 3.550 05/01/2022   Lab Results  Component Value Date   WBC 5.8 05/01/2022  HGB 14.8 05/01/2022   HCT 44.3 05/01/2022   MCV 82 05/01/2022   PLT 301 05/01/2022   Lab Results  Component Value Date   NA 141 11/07/2022   K 3.8 11/07/2022   CO2 24 11/07/2022   GLUCOSE 108 (H) 11/07/2022   BUN 15 11/07/2022   CREATININE 0.93 11/07/2022   BILITOT 0.3 11/07/2022   ALKPHOS 126 (H) 11/07/2022   AST 28 11/07/2022   ALT 38 (H) 11/07/2022   PROT 7.1 11/07/2022   ALBUMIN 4.3 11/07/2022   CALCIUM 9.8 11/07/2022   ANIONGAP 9 02/21/2020   EGFR 70 11/07/2022   Lab Results  Component Value Date   CHOL 178 11/07/2022   Lab Results  Component Value Date   HDL 54 11/07/2022   Lab Results  Component Value Date   LDLCALC 95 11/07/2022   Lab Results  Component Value Date   TRIG 171 (H) 11/07/2022   Lab Results  Component Value Date   CHOLHDL 3.3 11/07/2022   Lab Results  Component Value Date   HGBA1C 7.2 (H) 11/07/2022      Assessment & Plan:   Problem List Items Addressed This Visit       Cardiovascular and Mediastinum   Hypertension    BP Readings from Last 1 Encounters:  05/06/23 (!) 144/76   Uncontrolled with amlodipine and hydrochlorothiazide Started Olmesartan-hydrochlorothiazide 20-12.5 mg instead of hydrochlorothiazide Counseled for compliance with the medications Advised DASH diet and moderate exercise/walking, at least 150 mins/week      Relevant Medications   olmesartan-hydrochlorothiazide (BENICAR HCT) 20-12.5 MG tablet   amLODipine (NORVASC) 5 MG tablet   Other Relevant Orders   TSH   CBC with Differential/Platelet     Endocrine   Diabetes mellitus (HCC)    Lab Results  Component Value Date   HGBA1C 7.2 (H) 11/07/2022   Associated with HTN and HLD Well-controlled  overall On metformin 500 mg BID Advised to follow diabetic diet On statin F/u CMP and lipid panel Diabetic eye exam: Advised to follow up with Ophthalmology for diabetic eye exam      Relevant Medications   olmesartan-hydrochlorothiazide (BENICAR HCT) 20-12.5 MG tablet   metFORMIN (GLUCOPHAGE) 500 MG tablet   Other Relevant Orders   Hemoglobin A1c   CMP14+EGFR   Urine Microalbumin w/creat. ratio     Other   Vitamin D deficiency   Relevant Orders   VITAMIN D 25 Hydroxy (Vit-D Deficiency, Fractures)   HLD (hyperlipidemia)    On Lipitor to 20 mg QD Check lipid profile      Relevant Medications   olmesartan-hydrochlorothiazide (BENICAR HCT) 20-12.5 MG tablet   amLODipine (NORVASC) 5 MG tablet   Other Relevant Orders   Lipid panel   Encounter for general adult medical examination with abnormal findings - Primary    Physical exam as documented. Counseling done  re healthy lifestyle involving commitment to 150 minutes exercise per week, heart healthy diet, and attaining healthy weight.The importance of adequate sleep also discussed. Changes in health habits are decided on by the patient with goals and time frames  set for achieving them. Immunization and cancer screening needs are specifically addressed at this visit.      Other Visit Diagnoses     Encounter for immunization       Relevant Orders   Flu vaccine trivalent PF, 6mos and older(Flulaval,Afluria,Fluarix,Fluzone) (Completed)       Meds ordered this encounter  Medications   olmesartan-hydrochlorothiazide (BENICAR HCT) 20-12.5 MG tablet    Sig:  Take 1 tablet by mouth daily.    Dispense:  30 tablet    Refill:  2   metFORMIN (GLUCOPHAGE) 500 MG tablet    Sig: Take 1 tablet (500 mg total) by mouth 2 (two) times daily with a meal.    Dispense:  180 tablet    Refill:  1   amLODipine (NORVASC) 5 MG tablet    Sig: Take 1 tablet (5 mg total) by mouth daily.    Dispense:  90 tablet    Refill:  3    Follow-up:  Return in about 3 months (around 08/06/2023) for HTN.    Anabel Halon, MD

## 2023-05-07 ENCOUNTER — Other Ambulatory Visit: Payer: Self-pay | Admitting: Internal Medicine

## 2023-05-07 DIAGNOSIS — I10 Essential (primary) hypertension: Secondary | ICD-10-CM

## 2023-05-20 ENCOUNTER — Encounter: Payer: Self-pay | Admitting: Internal Medicine

## 2023-05-21 ENCOUNTER — Other Ambulatory Visit: Payer: Self-pay | Admitting: Internal Medicine

## 2023-05-21 DIAGNOSIS — E1169 Type 2 diabetes mellitus with other specified complication: Secondary | ICD-10-CM

## 2023-05-21 LAB — CBC WITH DIFFERENTIAL/PLATELET
Basophils Absolute: 0 10*3/uL (ref 0.0–0.2)
Basos: 1 %
EOS (ABSOLUTE): 0 10*3/uL (ref 0.0–0.4)
Eos: 1 %
Hematocrit: 41.1 % (ref 34.0–46.6)
Hemoglobin: 13.5 g/dL (ref 11.1–15.9)
Immature Grans (Abs): 0 10*3/uL (ref 0.0–0.1)
Immature Granulocytes: 0 %
Lymphocytes Absolute: 2.3 10*3/uL (ref 0.7–3.1)
Lymphs: 39 %
MCH: 26.9 pg (ref 26.6–33.0)
MCHC: 32.8 g/dL (ref 31.5–35.7)
MCV: 82 fL (ref 79–97)
Monocytes Absolute: 0.5 10*3/uL (ref 0.1–0.9)
Monocytes: 9 %
Neutrophils Absolute: 3 10*3/uL (ref 1.4–7.0)
Neutrophils: 50 %
Platelets: 305 10*3/uL (ref 150–450)
RBC: 5.01 x10E6/uL (ref 3.77–5.28)
RDW: 13.2 % (ref 11.7–15.4)
WBC: 5.8 10*3/uL (ref 3.4–10.8)

## 2023-05-21 LAB — MICROALBUMIN / CREATININE URINE RATIO
Creatinine, Urine: 164.7 mg/dL
Microalb/Creat Ratio: 2 mg/g{creat} (ref 0–29)
Microalbumin, Urine: 3.8 ug/mL

## 2023-05-21 LAB — CMP14+EGFR
ALT: 27 IU/L (ref 0–32)
AST: 23 IU/L (ref 0–40)
Albumin: 4 g/dL (ref 3.9–4.9)
Alkaline Phosphatase: 126 IU/L — ABNORMAL HIGH (ref 44–121)
BUN/Creatinine Ratio: 14 (ref 12–28)
BUN: 12 mg/dL (ref 8–27)
Bilirubin Total: 0.3 mg/dL (ref 0.0–1.2)
CO2: 23 mmol/L (ref 20–29)
Calcium: 9.8 mg/dL (ref 8.7–10.3)
Chloride: 104 mmol/L (ref 96–106)
Creatinine, Ser: 0.87 mg/dL (ref 0.57–1.00)
Globulin, Total: 2.8 g/dL (ref 1.5–4.5)
Glucose: 105 mg/dL — ABNORMAL HIGH (ref 70–99)
Potassium: 4.1 mmol/L (ref 3.5–5.2)
Sodium: 141 mmol/L (ref 134–144)
Total Protein: 6.8 g/dL (ref 6.0–8.5)
eGFR: 75 mL/min/{1.73_m2} (ref >59)

## 2023-05-21 LAB — VITAMIN D 25 HYDROXY (VIT D DEFICIENCY, FRACTURES): Vit D, 25-Hydroxy: 57.3 ng/mL (ref 30.0–100.0)

## 2023-05-21 LAB — TSH: TSH: 1.83 u[IU]/mL (ref 0.450–4.500)

## 2023-05-21 LAB — LIPID PANEL
Chol/HDL Ratio: 3.3 ratio (ref 0.0–4.4)
Cholesterol, Total: 170 mg/dL (ref 100–199)
HDL: 52 mg/dL (ref >39)
LDL Chol Calc (NIH): 100 mg/dL — ABNORMAL HIGH (ref 0–99)
Triglycerides: 101 mg/dL (ref 0–149)
VLDL Cholesterol Cal: 18 mg/dL (ref 5–40)

## 2023-05-21 LAB — HEMOGLOBIN A1C
Est. average glucose Bld gHb Est-mCnc: 163 mg/dL
Hgb A1c MFr Bld: 7.3 % — ABNORMAL HIGH (ref 4.8–5.6)

## 2023-05-21 MED ORDER — METFORMIN HCL 1000 MG PO TABS: 1000.0000 mg | ORAL_TABLET | Freq: Two times a day (BID) | ORAL | 1 refills | Status: DC

## 2023-05-28 ENCOUNTER — Ambulatory Visit (AMBULATORY_SURGERY_CENTER): Payer: BLUE CROSS/BLUE SHIELD | Admitting: *Deleted

## 2023-05-28 VITALS — Ht 67.5 in | Wt 176.0 lb

## 2023-05-28 DIAGNOSIS — Z1211 Encounter for screening for malignant neoplasm of colon: Secondary | ICD-10-CM

## 2023-05-28 MED ORDER — NA SULFATE-K SULFATE-MG SULF 17.5-3.13-1.6 GM/177ML PO SOLN: 1.0000 | Freq: Once | ORAL | 0 refills | Status: AC

## 2023-05-28 NOTE — Progress Notes (Signed)

## 2023-06-03 ENCOUNTER — Telehealth: Payer: Self-pay | Admitting: Internal Medicine

## 2023-06-03 NOTE — Telephone Encounter (Signed)
Spoke with patient. Pt has prep already. Another copy of prep instructions have been sent to mailing address. Pt advised to pick up a hard copy of instructions from the office if she doesn't receive them by the end of the week.

## 2023-06-03 NOTE — Telephone Encounter (Signed)
Message left with call back # to return call.

## 2023-06-03 NOTE — Telephone Encounter (Signed)
Patient rescheduled for procedure. Please advise.

## 2023-06-05 ENCOUNTER — Other Ambulatory Visit (HOSPITAL_COMMUNITY): Payer: Self-pay | Admitting: Internal Medicine

## 2023-06-05 DIAGNOSIS — Z1231 Encounter for screening mammogram for malignant neoplasm of breast: Secondary | ICD-10-CM

## 2023-06-10 ENCOUNTER — Other Ambulatory Visit: Payer: Self-pay | Admitting: Internal Medicine

## 2023-06-10 DIAGNOSIS — I10 Essential (primary) hypertension: Secondary | ICD-10-CM

## 2023-06-11 ENCOUNTER — Other Ambulatory Visit: Payer: Self-pay | Admitting: Internal Medicine

## 2023-06-11 ENCOUNTER — Encounter: Payer: BLUE CROSS/BLUE SHIELD | Admitting: Internal Medicine

## 2023-06-11 DIAGNOSIS — J301 Allergic rhinitis due to pollen: Secondary | ICD-10-CM

## 2023-06-12 ENCOUNTER — Ambulatory Visit (HOSPITAL_COMMUNITY)
Admission: RE | Admit: 2023-06-12 | Discharge: 2023-06-12 | Disposition: A | Discharge status: Home / Self Care | Payer: Medicaid Other | Source: Ambulatory Visit | Attending: Internal Medicine | Admitting: Internal Medicine

## 2023-06-12 ENCOUNTER — Encounter (HOSPITAL_COMMUNITY): Payer: Self-pay

## 2023-06-12 DIAGNOSIS — Z1231 Encounter for screening mammogram for malignant neoplasm of breast: Secondary | ICD-10-CM | POA: Insufficient documentation

## 2023-08-01 ENCOUNTER — Telehealth: Payer: Self-pay | Admitting: Internal Medicine

## 2023-08-01 NOTE — Telephone Encounter (Signed)
Left message to call back  

## 2023-08-01 NOTE — Telephone Encounter (Signed)
Pt came by office with FYI  Still has Metformin 500MG   Pt is taking 2 tabs 2x daily to reach her 1000mg  dosage in order to use up medication she has left.  Wants a call back, wants to know if this is okay of if she need to pick up her 1000Mg  script for Metformin.

## 2023-08-11 ENCOUNTER — Ambulatory Visit: Payer: Self-pay | Admitting: Internal Medicine

## 2023-08-13 ENCOUNTER — Encounter: Payer: Self-pay | Admitting: Internal Medicine

## 2023-08-13 ENCOUNTER — Ambulatory Visit: Payer: Medicaid Other | Admitting: Internal Medicine

## 2023-08-13 VITALS — BP 121/73 | HR 80 | Ht 67.5 in | Wt 174.0 lb

## 2023-08-13 DIAGNOSIS — E782 Mixed hyperlipidemia: Secondary | ICD-10-CM

## 2023-08-13 DIAGNOSIS — E1169 Type 2 diabetes mellitus with other specified complication: Secondary | ICD-10-CM | POA: Diagnosis not present

## 2023-08-13 DIAGNOSIS — Z23 Encounter for immunization: Secondary | ICD-10-CM | POA: Diagnosis not present

## 2023-08-13 DIAGNOSIS — I10 Essential (primary) hypertension: Secondary | ICD-10-CM | POA: Diagnosis not present

## 2023-08-13 DIAGNOSIS — Z7984 Long term (current) use of oral hypoglycemic drugs: Secondary | ICD-10-CM

## 2023-08-13 NOTE — Progress Notes (Signed)
Established Patient Office Visit  Subjective:  Patient ID: Amber Boone, female    DOB: 07/25/1961  Age: 62 y.o. MRN: [id number]  CC:  Chief Complaint  Patient presents with   Diabetes    Three month follow up     HPI Amber Boone is a 62 y.o. female with past medical history of HTN, type II DM, HLD and allergic rhinitis who presents for f/u of her chronic medical conditions.  HTN: BP is well-controlled. She has started taking  olmesartan-HCTZ and instead of only hydrochlorothiazide.  She also takes amlodipine 5 mg QD regularly. Patient denies headache, dizziness, chest pain, dyspnea or palpitations.  Type II DM with HLD: Her HbA1c was 7.3 in 09/24. She has been taking Metformin 1000 mg BID now. She denies any fatigue, polyuria or polyphagia currently.  She has been trying to follow low-carb diet.  She is also on atorvastatin for HLD.  She takes Zyrtec for allergic rhinitis.  She currently denies any dyspnea or wheezing.   She received Tdap vaccine in the office today.     Past Medical History:  Diagnosis Date   Allergy    seasonal   Diabetes mellitus without complication (HCC)    Hyperlipidemia    Hypertension    Left breast mass     Past Surgical History:  Procedure Laterality Date   BREAST CYST ASPIRATION Right 2014   BREAST EXCISIONAL BIOPSY Left 2021   FOCAL ATYPICAL DUCTAL HYPERPLASIA of the LEFT breast, 1 o'clock. The overall lesion appears to be a hamartoma   BREAST LUMPECTOMY WITH RADIOACTIVE SEED LOCALIZATION Left 02/24/2020   Procedure: LEFT BREAST LUMPECTOMY WITH RADIOACTIVE SEED LOCALIZATION;  Surgeon: Harriette Bouillon, MD;  Location: [facility];  Service: General;  Laterality: Left;   TOTAL ABDOMINAL HYSTERECTOMY     Secondary to fibroids-No cancer   TUBAL LIGATION      Family History  Problem Relation Age of Onset   Hypertension Mother    Cancer Father        ? Type   Hypertension Sister    Hypertension Sister    Hypertension  Daughter    Hyperlipidemia Son    Colon cancer Neg Hx    Colon polyps Neg Hx    Crohn's disease Neg Hx    Esophageal cancer Neg Hx    Rectal cancer Neg Hx    Stomach cancer Neg Hx    Ulcerative colitis Neg Hx     Social History   Socioeconomic History   Marital status: Single    Spouse name: Not on file   Number of children: Not on file   Years of education: Not on file   Highest education level: Not on file  Occupational History   Not on file  Tobacco Use   Smoking status: Never   Smokeless tobacco: Never  Vaping Use   Vaping status: Never Used  Substance and Sexual Activity   Alcohol use: No   Drug use: No   Sexual activity: Never    Birth control/protection: Surgical  Other Topics Concern   Not on file  Social History Narrative   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   3 children: Ages 62,30, 42 --all out of house   Social Determinants of Health   Financial Resource Strain: Not on file  Food Insecurity: Not on file  Transportation Needs: Not on file  Physical Activity: Not on file  Stress: Not on file  Social Connections: Not on file  Intimate Partner Violence: Not on file    Outpatient Medications Prior to Visit  Medication Sig Dispense Refill   amLODipine (NORVASC) 5 MG tablet Take 1 tablet (5 mg total) by mouth daily. 90 tablet 3   atorvastatin (LIPITOR) 20 MG tablet TAKE 1 TABLET BY MOUTH EVERY DAY 90 tablet 2   blood glucose meter kit and supplies KIT Dispense based on patient and insurance preference. Use up to four times daily as directed. (Patient not taking: Reported on 05/28/2023) 1 each 0   cetirizine (ZYRTEC) 10 MG tablet TAKE 1 TABLET BY MOUTH EVERY DAY 90 tablet 1   Cholecalciferol 2000 units CAPS Take 1 capsule (2,000 Units total) daily by mouth. 30 each 3   Lancets MISC Please dispense based on patient and insurance preference. Use as directed to monitor FSBS 1x daily. Dx:  E11.9 (Patient not taking: Reported on 05/28/2023) 100 each 1   metFORMIN (GLUCOPHAGE) 1000 MG tablet Take 1 tablet (1,000 mg total) by mouth 2 (two) times daily with a meal. 180 tablet 1   Multiple Vitamins-Minerals (MULTIVITAMIN PO) Take by mouth.     olmesartan-hydrochlorothiazide (BENICAR HCT) 20-12.5 MG tablet TAKE 1 TABLET BY MOUTH EVERY DAY 90 tablet 1   No facility-administered medications prior to visit.    Allergies  Allergen Reactions   Benazepril     Cough     ROS Review of Systems  Constitutional:  Negative for chills and fever.  HENT:  Negative for congestion, sinus pressure, sinus pain and sore throat.   Eyes:  Negative for pain and discharge.  Respiratory:  Negative for cough and shortness of breath.   Cardiovascular:  Negative for chest pain and palpitations.  Gastrointestinal:  Negative for abdominal pain, constipation, diarrhea, nausea and vomiting.  Endocrine: Negative for polydipsia and polyuria.  Genitourinary:  Negative for dysuria and hematuria.  Musculoskeletal:  Negative for neck pain and neck stiffness.  Skin:  Negative for rash.  Neurological:  Negative for dizziness and weakness.  Psychiatric/Behavioral:  Negative for agitation and behavioral problems.       Objective:    Physical Exam Vitals reviewed.  Constitutional:      General: She is not in acute distress.    Appearance: She is not diaphoretic.  HENT:     Head: Normocephalic and atraumatic.     Nose: Nose normal.     Mouth/Throat:     Mouth: Mucous membranes are moist.  Eyes:     General: No scleral icterus.    Extraocular Movements: Extraocular movements intact.  Cardiovascular:     Rate and Rhythm: Normal rate and regular rhythm.     Pulses: Normal pulses.     Heart sounds: Normal heart sounds. No murmur heard. Pulmonary:     Breath sounds: Normal breath sounds. No wheezing or rales.  Abdominal:     Palpations: Abdomen is soft.     Tenderness: There is no abdominal tenderness.   Musculoskeletal:     Cervical back: Neck supple. No tenderness.     Right lower leg: No edema.     Left lower leg: No edema.  Skin:    General: Skin is warm.     Findings: No rash.  Neurological:     General: No focal deficit present.     Mental Status: She is alert and oriented to person, place, and time.     Cranial Nerves: No cranial nerve deficit.  Sensory: No sensory deficit.     Motor: No weakness.  Psychiatric:        Mood and Affect: Mood normal.        Behavior: Behavior normal.     BP 121/73 (BP Location: Right Arm, Patient Position: Sitting, Cuff Size: Normal)   Pulse 80   Ht 5' 7.5" (1.715 m)   Wt 174 lb (78.9 kg)   SpO2 96%   BMI 26.85 kg/m  Wt Readings from Last 3 Encounters:  08/13/23 174 lb (78.9 kg)  05/28/23 176 lb (79.8 kg)  05/06/23 176 lb 9.6 oz (80.1 kg)    Lab Results  Component Value Date   TSH 1.830 05/20/2023   Lab Results  Component Value Date   WBC 5.8 05/20/2023   HGB 13.5 05/20/2023   HCT 41.1 05/20/2023   MCV 82 05/20/2023   PLT 305 05/20/2023   Lab Results  Component Value Date   NA 141 05/20/2023   K 4.1 05/20/2023   CO2 23 05/20/2023   GLUCOSE 105 (H) 05/20/2023   BUN 12 05/20/2023   CREATININE 0.87 05/20/2023   BILITOT 0.3 05/20/2023   ALKPHOS 126 (H) 05/20/2023   AST 23 05/20/2023   ALT 27 05/20/2023   PROT 6.8 05/20/2023   ALBUMIN 4.0 05/20/2023   CALCIUM 9.8 05/20/2023   ANIONGAP 9 02/21/2020   EGFR 75 05/20/2023   Lab Results  Component Value Date   CHOL 170 05/20/2023   Lab Results  Component Value Date   HDL 52 05/20/2023   Lab Results  Component Value Date   LDLCALC 100 (H) 05/20/2023   Lab Results  Component Value Date   TRIG 101 05/20/2023   Lab Results  Component Value Date   CHOLHDL 3.3 05/20/2023   Lab Results  Component Value Date   HGBA1C 7.3 (H) 05/20/2023      Assessment & Plan:   Problem List Items Addressed This Visit       Cardiovascular and Mediastinum    Hypertension    BP Readings from Last 1 Encounters:  08/13/23 121/73   Well-controlled with amlodipine 5 mg QD and Olmesartan-hydrochlorothiazide 20-12.5 mg QD now Check CMP as she recently started ARB Counseled for compliance with the medications Advised DASH diet and moderate exercise/walking, at least 150 mins/week        Endocrine   Diabetes mellitus (HCC) - Primary    Lab Results  Component Value Date   HGBA1C 7.3 (H) 05/20/2023   Associated with HTN and HLD Uncontrolled, but improving On metformin 1000 mg BID, dose was increased in the last visit as HbA1c > 7 Advised to follow diabetic diet On statin F/u CMP, HbA1c and lipid panel Diabetic eye exam: Advised to follow up with Ophthalmology for diabetic eye exam      Relevant Orders   Hemoglobin A1c   Basic Metabolic Panel (BMET)     Other   HLD (hyperlipidemia)    On Lipitor to 20 mg QD Checked lipid profile - LDL above goal, but has been taking Lipitor regularly now      Other Visit Diagnoses     Encounter for immunization       Relevant Orders   Tdap vaccine greater than or equal to 7yo IM (Completed)        No orders of the defined types were placed in this encounter.   Follow-up: Return in about 4 months (around 12/12/2023) for HTN and DM.    Anabel Halon, MD

## 2023-08-13 NOTE — Assessment & Plan Note (Addendum)
On Lipitor to 20 mg QD Checked lipid profile - LDL above goal, but has been taking Lipitor regularly now

## 2023-08-13 NOTE — Assessment & Plan Note (Addendum)
Lab Results  Component Value Date   HGBA1C 7.3 (H) 05/20/2023   Associated with HTN and HLD Uncontrolled, but improving On metformin 1000 mg BID, dose was increased in the last visit as HbA1c > 7 Advised to follow diabetic diet On statin F/u CMP, HbA1c and lipid panel Diabetic eye exam: Advised to follow up with Ophthalmology for diabetic eye exam

## 2023-08-13 NOTE — Patient Instructions (Signed)
Please continue to take medications as prescribed.  Please continue to follow low carb diet and perform moderate exercise/walking at least 150 mins/week.  Please get fasting blood tests done after 1 week.

## 2023-08-13 NOTE — Assessment & Plan Note (Addendum)
BP Readings from Last 1 Encounters:  08/13/23 121/73   Well-controlled with amlodipine 5 mg QD and Olmesartan-hydrochlorothiazide 20-12.5 mg QD now Check CMP as she recently started ARB Counseled for compliance with the medications Advised DASH diet and moderate exercise/walking, at least 150 mins/week

## 2023-08-20 ENCOUNTER — Encounter: Payer: Self-pay | Admitting: Internal Medicine

## 2023-08-22 DIAGNOSIS — E1169 Type 2 diabetes mellitus with other specified complication: Secondary | ICD-10-CM | POA: Diagnosis not present

## 2023-08-23 LAB — BASIC METABOLIC PANEL
BUN/Creatinine Ratio: 12 (ref 12–28)
BUN: 11 mg/dL (ref 8–27)
CO2: 21 mmol/L (ref 20–29)
Calcium: 9.6 mg/dL (ref 8.7–10.3)
Chloride: 106 mmol/L (ref 96–106)
Creatinine, Ser: 0.9 mg/dL (ref 0.57–1.00)
Glucose: 102 mg/dL — ABNORMAL HIGH (ref 70–99)
Potassium: 4.4 mmol/L (ref 3.5–5.2)
Sodium: 146 mmol/L — ABNORMAL HIGH (ref 134–144)
eGFR: 72 mL/min/{1.73_m2} (ref >59)

## 2023-08-23 LAB — HEMOGLOBIN A1C
Est. average glucose Bld gHb Est-mCnc: 151 mg/dL
Hgb A1c MFr Bld: 6.9 % — ABNORMAL HIGH (ref 4.8–5.6)

## 2023-08-26 ENCOUNTER — Encounter: Payer: Self-pay | Admitting: Internal Medicine

## 2023-09-01 ENCOUNTER — Telehealth: Payer: Self-pay

## 2023-09-01 NOTE — Telephone Encounter (Signed)
No return call, NO SHOW for pre visit.  PV and colonoscopy cancelled and no show letter mailed.

## 2023-09-01 NOTE — Telephone Encounter (Signed)
Left message for her to call back and reschedule Pre visit with nurse by 5 pm or her colonoscopy on 10/02/22 would be cancelled.

## 2023-10-01 ENCOUNTER — Telehealth: Payer: Self-pay

## 2023-10-01 NOTE — Telephone Encounter (Signed)
Mutiple attempts made to complete PV. Unable to reach patient. VM left. Pt to call the office back by 5 PM to have PV rescheduled. Pt made aware that in the event that we do not hear back from them their PV and scheduled procedure will be cancelled.  

## 2023-10-03 ENCOUNTER — Encounter: Payer: Medicaid Other | Admitting: Internal Medicine

## 2023-10-16 ENCOUNTER — Other Ambulatory Visit: Payer: Self-pay

## 2023-10-16 ENCOUNTER — Telehealth: Payer: Self-pay | Admitting: Internal Medicine

## 2023-10-16 ENCOUNTER — Ambulatory Visit (AMBULATORY_SURGERY_CENTER): Payer: Medicaid Other

## 2023-10-16 VITALS — Ht 67.5 in | Wt 165.0 lb

## 2023-10-16 DIAGNOSIS — Z1211 Encounter for screening for malignant neoplasm of colon: Secondary | ICD-10-CM

## 2023-10-16 DIAGNOSIS — E782 Mixed hyperlipidemia: Secondary | ICD-10-CM

## 2023-10-16 DIAGNOSIS — I10 Essential (primary) hypertension: Secondary | ICD-10-CM

## 2023-10-16 MED ORDER — ATORVASTATIN CALCIUM 20 MG PO TABS: 20.0000 mg | ORAL_TABLET | Freq: Every day | ORAL | 2 refills | Status: DC

## 2023-10-16 MED ORDER — AMLODIPINE BESYLATE 5 MG PO TABS: 5.0000 mg | ORAL_TABLET | Freq: Every day | ORAL | 3 refills | Status: AC

## 2023-10-16 MED ORDER — NA SULFATE-K SULFATE-MG SULF 17.5-3.13-1.6 GM/177ML PO SOLN: 1.0000 | Freq: Once | ORAL | 0 refills | Status: AC

## 2023-10-16 NOTE — Progress Notes (Signed)
 During pre-visit, patient states she has the prep at home (colonoscopy originally scheduled for 06/2024). RN instructed patient to look at the expiration date on the prep and if it is not expired, she can use this prep.  If it is expired, RN sent in a RX for prep to her local pharmacy. Per chart, suprep was sent in for her in Sept 2024.   She verbalized understanding.

## 2023-10-16 NOTE — Telephone Encounter (Signed)
 Prescription Request  10/16/2023  LOV: 08/13/2023  What is the name of the medication or equipment? atorvastatin  (LIPITOR) 20 MG tablet [[id number]]  amLODipine  (NORVASC ) 5 MG tablet [[id number]]    Have you contacted your pharmacy to request a refill? Yes   Which pharmacy would you like this sent to?   [id number] [address], [place] - [address] [place] [address] Phone: [phone] Fax: [phone]      Patient notified that their request is being sent to the clinical staff for review and that they should receive a response within 2 business days.   Please advise at [facility] [phone]

## 2023-10-16 NOTE — Telephone Encounter (Signed)
 Refills sent

## 2023-10-16 NOTE — Progress Notes (Signed)

## 2023-10-22 ENCOUNTER — Encounter: Payer: Medicaid Other | Admitting: Internal Medicine

## 2023-11-19 NOTE — Progress Notes (Unsigned)
 [facility] History and Physical   Primary Care Physician:  Anabel Halon, MD   Reason for Procedure:   CRCA screening  Plan:    colonoscopy     HPI: Raphaela Cannaday is a 63 y.o. female s/p negative screening colonoscopy 2014 - here for repeat exam   Past Medical History:  Diagnosis Date   Allergy    seasonal   Diabetes mellitus without complication (HCC)    Hyperlipidemia    Hypertension    Left breast mass     Past Surgical History:  Procedure Laterality Date   BREAST CYST ASPIRATION Right 2014   BREAST EXCISIONAL BIOPSY Left 2021   FOCAL ATYPICAL DUCTAL HYPERPLASIA of the LEFT breast, 1 o'clock. 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 Authorizing Provider  amLODipine (NORVASC) 5 MG tablet Take 1 tablet (5 mg total) by mouth daily. 10/16/23   Anabel Halon, MD  atorvastatin (LIPITOR) 20 MG tablet Take 1 tablet (20 mg total) by mouth daily. 10/16/23   Anabel Halon, MD  blood glucose meter kit and supplies KIT Dispense based on patient and insurance preference. Use up to four times daily as directed. 11/07/22   Anabel Halon, MD  cetirizine (ZYRTEC) 10 MG tablet TAKE 1 TABLET BY MOUTH EVERY DAY 06/11/23   Anabel Halon, MD  Cholecalciferol 2000 units CAPS Take 1 capsule (2,000 Units total) daily by mouth. 07/25/17   Dorena Bodo, PA-C  Lancets MISC Please dispense based on patient and insurance preference. Use as directed to monitor FSBS 1x daily. Dx: E11.9 03/27/20   Salley Scarlet, MD  metFORMIN (GLUCOPHAGE) 1000 MG tablet Take 1 tablet  (1,000 mg total) by mouth 2 (two) times daily with a meal. 05/21/23   Anabel Halon, MD  Multiple Vitamins-Minerals (MULTIVITAMIN PO) Take by mouth.    [provider]  olmesartan-hydrochlorothiazide (BENICAR HCT) 20-12.5 MG tablet TAKE 1 TABLET BY MOUTH EVERY DAY 05/08/23   Anabel Halon, MD    Current Outpatient Medications  Medication Sig Dispense Refill   amLODipine (NORVASC) 5 MG tablet Take 1 tablet (5 mg total) by mouth daily. 90 tablet 3   atorvastatin (LIPITOR) 20 MG tablet Take 1 tablet (20 mg total) by mouth daily. 90 tablet 2   blood glucose meter kit and supplies KIT Dispense based on patient and insurance preference. Use up to four times daily as directed. 1 each 0   cetirizine (ZYRTEC) 10 MG tablet TAKE 1 TABLET BY MOUTH EVERY DAY 90 tablet 1   Cholecalciferol 2000 units CAPS Take 1 capsule (2,000 Units total) daily by mouth. 30 each 3   Lancets MISC Please dispense based on patient and insurance preference. Use as directed to monitor FSBS 1x daily. Dx: E11.9 100 each 1   metFORMIN (GLUCOPHAGE) 1000 MG tablet Take 1 tablet (1,000 mg total) by mouth 2 (two) times daily with a meal. 180 tablet 1   Multiple Vitamins-Minerals (MULTIVITAMIN PO) Take by mouth.     olmesartan-hydrochlorothiazide (BENICAR HCT) 20-12.5 MG tablet TAKE 1 TABLET BY MOUTH EVERY  DAY 90 tablet 1   No current facility-administered medications for this visit.    Allergies as of 11/20/2023 - Review Complete 10/16/2023  Allergen Reaction Noted   Benazepril  02/17/2014    Family History  Problem Relation Age of Onset   Hypertension Mother    Cancer Father        ? Type   Hypertension Sister    Hypertension Sister    Hypertension Daughter    Hyperlipidemia Son    Colon cancer Neg Hx    Colon polyps Neg Hx    Crohn's disease Neg Hx    Esophageal cancer Neg Hx    Rectal cancer Neg Hx    Stomach cancer Neg Hx    Ulcerative colitis Neg Hx     Social History   Socioeconomic History    Marital status: Single    Spouse name: Not on file   Number of children: Not on file   Years of education: Not on file   Highest education level: Not on file  Occupational History   Not on file  Tobacco Use   Smoking status: Never   Smokeless tobacco: Never  Vaping Use   Vaping status: Never Used  Substance and Sexual Activity   Alcohol use: No   Drug use: No   Sexual activity: Never    Birth control/protection: Surgical  Other Topics Concern   Not on file  Social History Narrative   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   3 children: Ages 58,30, 79 --all out of house   Social Drivers of Corporate investment banker Strain: Not on file  Food Insecurity: Not on file  Transportation Needs: Not on file  Physical Activity: Not on file  Stress: Not on file  Social Connections: Not on file  Intimate Partner Violence: Not on file    Review of Systems: Positive for *** All other review of systems negative except as mentioned in the HPI.  Physical Exam: Vital signs There were no vitals taken for this visit.  General:   Alert,  Well-developed, well-nourished, pleasant and cooperative in NAD Lungs:  Clear throughout to auscultation.   Heart:  Regular rate and rhythm; no murmurs, clicks, rubs,  or gallops. Abdomen:  Soft, nontender and nondistended. Normal bowel sounds.   Neuro/Psych:  Alert and cooperative. Normal mood and affect. A and O x 3   @Kerryn Tennant  Sena Slate, MD, [facility] [phone] (pager) 11/19/2023 9:17 PM@

## 2023-11-20 ENCOUNTER — Ambulatory Visit: Payer: Medicaid Other | Admitting: Internal Medicine

## 2023-11-20 ENCOUNTER — Encounter: Payer: Self-pay | Admitting: Internal Medicine

## 2023-11-20 VITALS — BP 120/66 | HR 65 | Temp 97.2°F | Resp 14 | Ht 67.5 in | Wt 165.0 lb

## 2023-11-20 DIAGNOSIS — I10 Essential (primary) hypertension: Secondary | ICD-10-CM | POA: Diagnosis not present

## 2023-11-20 DIAGNOSIS — D120 Benign neoplasm of cecum: Secondary | ICD-10-CM | POA: Diagnosis not present

## 2023-11-20 DIAGNOSIS — E119 Type 2 diabetes mellitus without complications: Secondary | ICD-10-CM | POA: Diagnosis not present

## 2023-11-20 DIAGNOSIS — Z1211 Encounter for screening for malignant neoplasm of colon: Secondary | ICD-10-CM

## 2023-11-20 DIAGNOSIS — E785 Hyperlipidemia, unspecified: Secondary | ICD-10-CM | POA: Diagnosis not present

## 2023-11-20 MED ORDER — SODIUM CHLORIDE 0.9 % IV SOLN: 500.0000 mL | INTRAVENOUS | Status: DC

## 2023-11-20 NOTE — Progress Notes (Signed)
 A/o x 3, VSS, gd SR's, pleased with anesthesia, report to RN

## 2023-11-20 NOTE — Patient Instructions (Addendum)
 I found and removed one tiny polyp that looks benign.  I will let you know pathology results and when to have another routine colonoscopy by mail and/or My Chart.  I appreciate the opportunity to care for you. Iva Boop, MD, [facility] [place]  -Handout on polyps provided -await pathology results -repeat colonoscopy for surveillance recommended. Date to be determined when pathology result become available   -Continue present medications    YOU HAD AN ENDOSCOPIC PROCEDURE TODAY AT THE [facility]:   Refer to the procedure report that was given to you for any specific questions about what was found during the examination.  If the procedure report does not answer your questions, please call your gastroenterologist to clarify.  If you requested that your care partner not be given the details of your procedure findings, then the procedure report has been included in a sealed envelope for you to review at your convenience later.  YOU SHOULD EXPECT: Some feelings of bloating in the abdomen. Passage of more gas than usual.  Walking can help get rid of the air that was put into your GI tract during the procedure and reduce the bloating. If you had a lower endoscopy (such as a colonoscopy or flexible sigmoidoscopy) you may notice spotting of blood in your stool or on the toilet paper. If you underwent a bowel prep for your procedure, you may not have a normal bowel movement for a few days.  Please Note:  You might notice some irritation and congestion in your nose or some drainage.  This is from the oxygen used during your procedure.  There is no need for concern and it should clear up in a day or so.  SYMPTOMS TO REPORT IMMEDIATELY:  Following lower endoscopy (colonoscopy or flexible sigmoidoscopy):  Excessive amounts of blood in the stool  Significant tenderness or worsening of abdominal pains  Swelling of the abdomen that is new, acute  Fever of 100F or higher  For urgent or emergent issues,  a gastroenterologist can be reached at any hour by calling [phone]. Do not use MyChart messaging for urgent concerns.    DIET:  We do recommend a small meal at first, but then you may proceed to your regular diet.  Drink plenty of fluids but you should avoid alcoholic beverages for 24 hours.  ACTIVITY:  You should plan to take it easy for the rest of today and you should NOT DRIVE or use heavy machinery until tomorrow (because of the sedation medicines used during the test).    FOLLOW UP: Our staff will call the number listed on your records the next business day following your procedure.  We will call around 7:15- 8:00 am to check on you and address any questions or concerns that you may have regarding the information given to you following your procedure. If we do not reach you, we will leave a message.     If any biopsies were taken you will be contacted by phone or by letter within the next 1-3 weeks.  Please call us at [phone] if you have not heard about the biopsies in 3 weeks.    SIGNATURES/CONFIDENTIALITY: You and/or your care partner have signed paperwork which will be entered into your electronic medical record.  These signatures attest to the fact that that the information above on your After Visit Summary has been reviewed and is understood.  Full responsibility of the confidentiality of this discharge information lies with you and/or your care-partner.

## 2023-11-20 NOTE — Op Note (Signed)
 [facility] Patient Name: Amber Boone Procedure Date: 11/20/2023 1:20 PM MRN: [id number] Endoscopist: Iva Boop , MD, [id number] Age: 63 Referring MD:  Date of Birth: November 16, 1960 Gender: Female Account #: [id number] Procedure:                Colonoscopy Indications:              Screening for colorectal malignant neoplasm Medicines:                Monitored Anesthesia Care Procedure:                Pre-Anesthesia Assessment:                           - Prior to the procedure, a History and Physical                            was performed, and patient medications and                            allergies were reviewed. The patient's tolerance of                            previous anesthesia was also reviewed. The risks                            and benefits of the procedure and the sedation                            options and risks were discussed with the patient.                            All questions were answered, and informed consent                            was obtained. Prior Anticoagulants: The patient has                            taken no anticoagulant or antiplatelet agents. ASA                            Grade Assessment: II - A patient with mild systemic                            disease. After reviewing the risks and benefits,                            the patient was deemed in satisfactory condition to                            undergo the procedure.                           After obtaining informed consent, the colonoscope  was passed under direct vision. Throughout the                            procedure, the patient's blood pressure, pulse, and                            oxygen saturations were monitored continuously. The                            CF [id number] #[id number] was introduced through the anus                            and advanced to the the cecum, identified by                            appendiceal  orifice and ileocecal valve. The                            colonoscopy was performed without difficulty. The                            patient tolerated the procedure well. The quality                            of the bowel preparation was good. The ileocecal                            valve, appendiceal orifice, and rectum were                            photographed. The bowel preparation used was SUPREP                            via split dose instruction. Scope In: 1:27:49 PM Scope Out: 1:42:06 PM Scope Withdrawal Time: 0 hours 12 minutes 0 seconds  Total Procedure Duration: 0 hours 14 minutes 17 seconds  Findings:                 The perianal and digital rectal examinations were                            normal.                           A 5 mm polyp was found in the cecum. The polyp was                            sessile. The polyp was removed with a cold snare.                            Resection and retrieval were complete. Verification                            of patient identification for the specimen was  done. Estimated blood loss was minimal.                           The exam was otherwise without abnormality on                            direct and retroflexion views. Complications:            No immediate complications. Estimated Blood Loss:     Estimated blood loss was minimal. Impression:               - One 5 mm polyp in the cecum, removed with a cold                            snare. Resected and retrieved.                           - The examination was otherwise normal on direct                            and retroflexion views. Recommendation:           - Patient has a contact number available for                            emergencies. The signs and symptoms of potential                            delayed complications were discussed with the                            patient. Return to normal activities tomorrow.                             Written discharge instructions were provided to the                            patient.                           - Resume previous diet.                           - Continue present medications.                           - Repeat colonoscopy is recommended. The                            colonoscopy date will be determined after pathology                            results from today's exam become available for                            review. Iva Boop, MD 11/20/2023 1:49:12 PM This report has been  signed electronically.

## 2023-11-20 NOTE — Progress Notes (Signed)
 Pt's states no medical or surgical changes since previsit or office visit.

## 2023-11-20 NOTE — Progress Notes (Signed)
 Called to room to assist during endoscopic procedure.  Patient ID and intended procedure confirmed with present staff. Received instructions for my participation in the procedure from the performing physician.

## 2023-11-21 ENCOUNTER — Telehealth: Payer: Self-pay

## 2023-11-21 NOTE — Telephone Encounter (Signed)
 Attempted to reach patient for post-procedure f/u call. No answer. Left message for her to please not hesitate to call ir she has any questions/concerns regarding her care.

## 2023-11-25 ENCOUNTER — Encounter: Payer: Self-pay | Admitting: Internal Medicine

## 2023-11-25 DIAGNOSIS — Z860101 Personal history of adenomatous and serrated colon polyps: Secondary | ICD-10-CM | POA: Insufficient documentation

## 2023-11-25 HISTORY — DX: Personal history of adenomatous and serrated colon polyps: Z86.0101

## 2023-11-25 LAB — SURGICAL PATHOLOGY

## 2023-12-12 ENCOUNTER — Ambulatory Visit: Payer: Medicaid Other | Admitting: Internal Medicine

## 2023-12-12 ENCOUNTER — Telehealth: Payer: Self-pay | Admitting: Internal Medicine

## 2023-12-12 NOTE — Telephone Encounter (Signed)
 Patient very upset that she could not be seen today says she was told the wrong appt time and does not want to have to wait until next available in June. Wanting to speak with someone in regards to medication. Please advise Thank you

## 2023-12-12 NOTE — Telephone Encounter (Signed)
 Tried calling pt back unable to reach her or leave vm.

## 2023-12-18 ENCOUNTER — Encounter: Payer: Self-pay | Admitting: Internal Medicine

## 2023-12-23 ENCOUNTER — Ambulatory Visit (INDEPENDENT_AMBULATORY_CARE_PROVIDER_SITE_OTHER): Admitting: Internal Medicine

## 2023-12-23 ENCOUNTER — Encounter: Payer: Self-pay | Admitting: Internal Medicine

## 2023-12-23 VITALS — BP 136/82 | HR 84 | Ht 67.5 in | Wt 168.2 lb

## 2023-12-23 DIAGNOSIS — J0110 Acute frontal sinusitis, unspecified: Secondary | ICD-10-CM | POA: Diagnosis not present

## 2023-12-23 DIAGNOSIS — I10 Essential (primary) hypertension: Secondary | ICD-10-CM

## 2023-12-23 DIAGNOSIS — E782 Mixed hyperlipidemia: Secondary | ICD-10-CM

## 2023-12-23 DIAGNOSIS — J301 Allergic rhinitis due to pollen: Secondary | ICD-10-CM

## 2023-12-23 DIAGNOSIS — E1169 Type 2 diabetes mellitus with other specified complication: Secondary | ICD-10-CM

## 2023-12-23 DIAGNOSIS — Z7984 Long term (current) use of oral hypoglycemic drugs: Secondary | ICD-10-CM | POA: Diagnosis not present

## 2023-12-23 MED ORDER — METFORMIN HCL 1000 MG PO TABS: 1000.0000 mg | ORAL_TABLET | Freq: Two times a day (BID) | ORAL | 1 refills | Status: DC

## 2023-12-23 MED ORDER — FLUTICASONE PROPIONATE 50 MCG/ACT NA SUSP: 2.0000 | Freq: Every day | NASAL | 2 refills | Status: DC

## 2023-12-23 MED ORDER — OLMESARTAN MEDOXOMIL-HCTZ 20-12.5 MG PO TABS: 1.0000 | ORAL_TABLET | Freq: Every day | ORAL | 3 refills | Status: DC

## 2023-12-23 MED ORDER — AMOXICILLIN-POT CLAVULANATE 875-125 MG PO TABS: 1.0000 | ORAL_TABLET | Freq: Two times a day (BID) | ORAL | 0 refills | Status: DC

## 2023-12-23 NOTE — Assessment & Plan Note (Addendum)
 Well-controlled with Zyrtec, refilled Started Flonase Sinus rinse as tolerated Advised to use vaporizer for nasal congestion

## 2023-12-23 NOTE — Assessment & Plan Note (Signed)
 BP Readings from Last 1 Encounters:  12/23/23 136/82   Well-controlled with amlodipine 5 mg QD and Olmesartan-hydrochlorothiazide 20-12.5 mg QD now Counseled for compliance with the medications Advised DASH diet and moderate exercise/walking, at least 150 mins/week

## 2023-12-23 NOTE — Assessment & Plan Note (Signed)
 Although most of her symptoms are related to allergies, due to recent worsening of symptoms despite symptomatic treatment, started empiric Augmentin Flonase for nasal congestion

## 2023-12-23 NOTE — Progress Notes (Signed)
 Established Patient Office Visit  Subjective:  Patient ID: Amber Boone, female    DOB: 06/02/1961  Age: 63 y.o. MRN: [id number]  CC:  Chief Complaint  Patient presents with   Medical Management of Chronic Issues    4 month f/u    Sinus Problem    Pt reports sx of sinus infection ongoing for 2-3 weeks.    HPI Amber Boone is a 63 y.o. female with past medical history of HTN, type II DM, HLD and allergic rhinitis who presents for f/u of her chronic medical conditions.  HTN: BP is well-controlled. She has been taking olmesartan-HCTZ and amlodipine 5 mg QD regularly. Patient denies headache, dizziness, chest pain, dyspnea or palpitations.  Type II DM with HLD: Her HbA1c was 6.9 in 12/24. She has been taking Metformin 1000 mg BID now. She denies any fatigue, polyuria or polyphagia currently.  She has been trying to follow low-carb diet.  She is also on atorvastatin for HLD.  She takes Zyrtec for allergic rhinitis.  She currently denies any dyspnea or wheezing.  She reports recent worsening of nasal congestion, sinus pressure related headache and pain behind her eyes for the last 3 weeks.  Denies any dyspnea or wheezing currently.     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 The overall lesion appears to be a hamartoma   BREAST LUMPECTOMY WITH RADIOACTIVE SEED LOCALIZATION Left 02/24/2020   Procedure: LEFT BREAST LUMPECTOMY WITH RADIOACTIVE SEED LOCALIZATION;  Surgeon: Harriette Bouillon, MD;  Location: [facility];  Service: General;  Laterality: Left;   TOTAL ABDOMINAL HYSTERECTOMY     Secondary to  fibroids-No cancer   TUBAL LIGATION      Family History  Problem Relation Age of Onset   Hypertension Mother    Cancer Father        ? Type   Hypertension Sister    Hypertension Sister    Hypertension Daughter    Hyperlipidemia Son    Colon cancer Neg Hx    Colon polyps Neg Hx    Crohn's disease Neg Hx    Esophageal cancer Neg Hx    Rectal cancer Neg Hx    Stomach cancer Neg Hx    Ulcerative colitis Neg Hx     Social History   Socioeconomic History   Marital status: Single    Spouse name: Not on file   Number of children: Not on file   Years of education: Not on file   Highest education level: Not on file  Occupational History   Not on file  Tobacco Use   Smoking status: Never   Smokeless tobacco: Never  Vaping Use   Vaping status: Never Used  Substance and Sexual Activity   Alcohol use: No   Drug use: No   Sexual activity: Never    Birth control/protection: Surgical  Other Topics Concern   Not on file  Social History Narrative   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   3 children: Ages  33,30, 20 --all out of house   Social Drivers of Corporate investment banker Strain: Not on file  Food Insecurity: Not on file  Transportation Needs: Not on file  Physical Activity: Not on file  Stress: Not on file  Social Connections: Not on file  Intimate Partner Violence: Not on file    Outpatient Medications Prior to Visit  Medication Sig Dispense Refill   amLODipine (NORVASC) 5 MG tablet Take 1 tablet (5 mg total) by mouth daily. 90 tablet 3   atorvastatin (LIPITOR) 20 MG tablet Take 1 tablet (20 mg total) by mouth daily. 90 tablet 2   blood glucose meter kit and supplies KIT Dispense based on patient and insurance preference. Use up to four times daily as directed. 1 each 0   cetirizine (ZYRTEC) 10 MG tablet TAKE 1 TABLET BY MOUTH EVERY DAY 90 tablet 1   Cholecalciferol 2000 units CAPS Take 1  capsule (2,000 Units total) daily by mouth. 30 each 3   Lancets MISC Please dispense based on patient and insurance preference. Use as directed to monitor FSBS 1x daily. Dx: E11.9 100 each 1   Multiple Vitamins-Minerals (MULTIVITAMIN PO) Take by mouth.     metFORMIN (GLUCOPHAGE) 1000 MG tablet Take 1 tablet (1,000 mg total) by mouth 2 (two) times daily with a meal. 180 tablet 1   olmesartan-hydrochlorothiazide (BENICAR HCT) 20-12.5 MG tablet TAKE 1 TABLET BY MOUTH EVERY DAY 90 tablet 1   No facility-administered medications prior to visit.    Allergies  Allergen Reactions   Benazepril     Cough     ROS Review of Systems  Constitutional:  Negative for chills and fever.  HENT:  Positive for congestion and sinus pressure. Negative for sore throat.   Eyes:  Negative for pain and discharge.  Respiratory:  Negative for cough and shortness of breath.   Cardiovascular:  Negative for chest pain and palpitations.  Gastrointestinal:  Negative for abdominal pain, constipation, diarrhea, nausea and vomiting.  Endocrine: Negative for polydipsia and polyuria.  Genitourinary:  Negative for dysuria and hematuria.  Musculoskeletal:  Negative for neck pain and neck stiffness.  Skin:  Negative for rash.  Neurological:  Negative for dizziness and weakness.  Psychiatric/Behavioral:  Negative for agitation and behavioral problems.       Objective:    Physical Exam Vitals reviewed.  Constitutional:      General: She is not in acute distress.    Appearance: She is not diaphoretic.  HENT:     Head: Normocephalic and atraumatic.     Nose: Congestion present.     Right Sinus: Frontal sinus tenderness present.     Left Sinus: Frontal sinus tenderness present.     Mouth/Throat:     Mouth: Mucous membranes are moist.  Eyes:     General: No scleral icterus.    Extraocular Movements: Extraocular movements intact.  Cardiovascular:     Rate and Rhythm: Normal rate and regular rhythm.     Heart  sounds: Normal heart sounds. No murmur heard. Pulmonary:     Breath sounds: Normal breath sounds. No wheezing or rales.  Musculoskeletal:     Cervical back: Neck supple. No tenderness.     Right lower leg: No edema.     Left lower leg: No edema.  Skin:    General: Skin is warm.     Findings: No rash.  Neurological:     General: No focal deficit present.     Mental Status: She  is alert and oriented to person, place, and time.     Sensory: No sensory deficit.     Motor: No weakness.  Psychiatric:        Mood and Affect: Mood normal.        Behavior: Behavior normal.     BP 136/82 (BP Location: Right Arm)   Pulse 84   Ht 5' 7.5" (1.715 m)   Wt 168 lb 3.2 oz (76.3 kg)   SpO2 97%   BMI 25.96 kg/m  Wt Readings from Last 3 Encounters:  12/23/23 168 lb 3.2 oz (76.3 kg)  11/20/23 165 lb (74.8 kg)  10/16/23 165 lb (74.8 kg)    Lab Results  Component Value Date   TSH 1.830 05/20/2023   Lab Results  Component Value Date   WBC 5.8 05/20/2023   HGB 13.5 05/20/2023   HCT 41.1 05/20/2023   MCV 82 05/20/2023   PLT 305 05/20/2023   Lab Results  Component Value Date   NA 146 (H) 08/22/2023   K 4.4 08/22/2023   CO2 21 08/22/2023   GLUCOSE 102 (H) 08/22/2023   BUN 11 08/22/2023   CREATININE 0.90 08/22/2023   BILITOT 0.3 05/20/2023   ALKPHOS 126 (H) 05/20/2023   AST 23 05/20/2023   ALT 27 05/20/2023   PROT 6.8 05/20/2023   ALBUMIN 4.0 05/20/2023   CALCIUM 9.6 08/22/2023   ANIONGAP 9 02/21/2020   EGFR 72 08/22/2023   Lab Results  Component Value Date   CHOL 170 05/20/2023   Lab Results  Component Value Date   HDL 52 05/20/2023   Lab Results  Component Value Date   LDLCALC 100 (H) 05/20/2023   Lab Results  Component Value Date   TRIG 101 05/20/2023   Lab Results  Component Value Date   CHOLHDL 3.3 05/20/2023   Lab Results  Component Value Date   HGBA1C 6.9 (H) 08/22/2023      Assessment & Plan:   Problem List Items Addressed This Visit        Cardiovascular and Mediastinum   Hypertension   BP Readings from Last 1 Encounters:  12/23/23 136/82   Well-controlled with amlodipine 5 mg QD and Olmesartan-hydrochlorothiazide 20-12.5 mg QD now Counseled for compliance with the medications Advised DASH diet and moderate exercise/walking, at least 150 mins/week      Relevant Medications   olmesartan-hydrochlorothiazide (BENICAR HCT) 20-12.5 MG tablet     Respiratory   Allergic rhinitis   Well-controlled with Zyrtec, refilled Started Flonase Sinus rinse as tolerated Advised to use vaporizer for nasal congestion      Relevant Medications   fluticasone (FLONASE) 50 MCG/ACT nasal spray   Acute non-recurrent frontal sinusitis   Although most of her symptoms are related to allergies, due to recent worsening of symptoms despite symptomatic treatment, started empiric Augmentin Flonase for nasal congestion      Relevant Medications   amoxicillin-clavulanate (AUGMENTIN) 875-125 MG tablet   fluticasone (FLONASE) 50 MCG/ACT nasal spray     Endocrine   Diabetes mellitus (HCC) - Primary   Lab Results  Component Value Date   HGBA1C 6.9 (H) 08/22/2023   Associated with HTN and HLD Well-controlled On metformin 1000 mg BID, dose was increased in the last visit as HbA1c > 7 Advised to follow diabetic diet On statin F/u CMP, HbA1c and lipid panel Diabetic eye exam: Advised to follow up with Ophthalmology for diabetic eye exam      Relevant Medications   olmesartan-hydrochlorothiazide (BENICAR HCT) 20-12.5 MG  tablet   metFORMIN (GLUCOPHAGE) 1000 MG tablet   Other Relevant Orders   Bayer DCA Hb A1c Waived     Other   HLD (hyperlipidemia)   On Lipitor to 20 mg QD Checked lipid profile - LDL above goal, but has been taking Lipitor regularly now      Relevant Medications   olmesartan-hydrochlorothiazide (BENICAR HCT) 20-12.5 MG tablet     Meds ordered this encounter  Medications   amoxicillin-clavulanate (AUGMENTIN) 875-125  MG tablet    Sig: Take 1 tablet by mouth 2 (two) times daily.    Dispense:  14 tablet    Refill:  0   olmesartan-hydrochlorothiazide (BENICAR HCT) 20-12.5 MG tablet    Sig: Take 1 tablet by mouth daily.    Dispense:  90 tablet    Refill:  3   fluticasone (FLONASE) 50 MCG/ACT nasal spray    Sig: Place 2 sprays into both nostrils daily.    Dispense:  16 g    Refill:  2   metFORMIN (GLUCOPHAGE) 1000 MG tablet    Sig: Take 1 tablet (1,000 mg total) by mouth 2 (two) times daily with a meal.    Dispense:  180 tablet    Refill:  1    Follow-up: Return in about 5 months (around 05/24/2024) for Annual physical.    Meldon Sport, MD

## 2023-12-23 NOTE — Patient Instructions (Addendum)
 Please start taking Augmentin as prescribed. Please use Flonase for nasal congestion. Please take Zyrtec or Claritin for allergies.  Please use vaporizer for nasal congestion.  Please continue to take medications as prescribed.  Please continue to follow low carb diet and perform moderate exercise/walking at least 150 mins/week.

## 2023-12-23 NOTE — Assessment & Plan Note (Signed)
 Lab Results  Component Value Date   HGBA1C 6.9 (H) 08/22/2023   Associated with HTN and HLD Well-controlled On metformin 1000 mg BID, dose was increased in the last visit as HbA1c > 7 Advised to follow diabetic diet On statin F/u CMP, HbA1c and lipid panel Diabetic eye exam: Advised to follow up with Ophthalmology for diabetic eye exam

## 2023-12-23 NOTE — Assessment & Plan Note (Signed)
 On Lipitor to 20 mg QD Checked lipid profile - LDL above goal, but has been taking Lipitor regularly now

## 2023-12-24 LAB — BAYER DCA HB A1C WAIVED: HB A1C (BAYER DCA - WAIVED): 6.8 % — ABNORMAL HIGH (ref 4.8–5.6)

## 2023-12-29 ENCOUNTER — Ambulatory Visit: Admitting: Internal Medicine

## 2024-02-24 ENCOUNTER — Ambulatory Visit: Admitting: Internal Medicine

## 2024-05-24 ENCOUNTER — Ambulatory Visit (INDEPENDENT_AMBULATORY_CARE_PROVIDER_SITE_OTHER): Admitting: Internal Medicine

## 2024-05-24 ENCOUNTER — Encounter: Payer: Self-pay | Admitting: Internal Medicine

## 2024-05-24 VITALS — BP 138/74 | HR 76 | Ht 67.5 in | Wt 171.8 lb

## 2024-05-24 DIAGNOSIS — E559 Vitamin D deficiency, unspecified: Secondary | ICD-10-CM

## 2024-05-24 DIAGNOSIS — E782 Mixed hyperlipidemia: Secondary | ICD-10-CM | POA: Diagnosis not present

## 2024-05-24 DIAGNOSIS — E1169 Type 2 diabetes mellitus with other specified complication: Secondary | ICD-10-CM | POA: Diagnosis not present

## 2024-05-24 DIAGNOSIS — Z7984 Long term (current) use of oral hypoglycemic drugs: Secondary | ICD-10-CM | POA: Diagnosis not present

## 2024-05-24 DIAGNOSIS — Z1231 Encounter for screening mammogram for malignant neoplasm of breast: Secondary | ICD-10-CM

## 2024-05-24 DIAGNOSIS — Z23 Encounter for immunization: Secondary | ICD-10-CM

## 2024-05-24 DIAGNOSIS — J301 Allergic rhinitis due to pollen: Secondary | ICD-10-CM | POA: Diagnosis not present

## 2024-05-24 DIAGNOSIS — I10 Essential (primary) hypertension: Secondary | ICD-10-CM | POA: Diagnosis not present

## 2024-05-24 DIAGNOSIS — Z0001 Encounter for general adult medical examination with abnormal findings: Secondary | ICD-10-CM | POA: Diagnosis not present

## 2024-05-24 MED ORDER — SPIKEVAX 50 MCG/0.5ML IM SUSY: 0.5000 mL | PREFILLED_SYRINGE | Freq: Once | INTRAMUSCULAR | 0 refills | Status: AC

## 2024-05-24 MED ORDER — METFORMIN HCL 1000 MG PO TABS: 1000.0000 mg | ORAL_TABLET | Freq: Two times a day (BID) | ORAL | 1 refills | Status: AC

## 2024-05-24 NOTE — Progress Notes (Addendum)
 Established Patient Office Visit  Subjective:  Patient ID: Amber Boone, female    DOB: 11-Jan-1961  Age: 63 y.o. MRN: [id number]  CC:  Chief Complaint  Patient presents with   Annual Exam    Cpe    HPI Boone Amber is a 63 y.o. female with past medical history of HTN, type II DM, HLD and allergic rhinitis who presents for f/u of her chronic medical conditions.  HTN: BP is well-controlled. She has been taking olmesartan -HCTZ and amlodipine  5 mg QD regularly. Patient denies headache, dizziness, chest pain, dyspnea or palpitations.  Type II DM with HLD: Her HbA1c was 6.8 in 04/25. She has been taking Metformin  1000 mg BID now. She denies any fatigue, polyuria or polyphagia currently.  She has been trying to follow low-carb diet.  She is also on atorvastatin  for HLD.  She takes Zyrtec  for allergic rhinitis.  She currently denies any dyspnea or wheezing. Denies any dyspnea or wheezing currently.     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 The overall lesion appears to be a hamartoma   BREAST LUMPECTOMY WITH RADIOACTIVE SEED LOCALIZATION Left 02/24/2020   Procedure: LEFT BREAST LUMPECTOMY WITH RADIOACTIVE SEED LOCALIZATION;  Surgeon: Vanderbilt Ned, MD;  [facility];  Service: General;  Laterality: Left;   TOTAL ABDOMINAL HYSTERECTOMY     Secondary to fibroids-No cancer   TUBAL LIGATION      Family History  Problem Relation Age of Onset   Hypertension Mother    Cancer Father        ? Type   Hypertension Sister    Hypertension Sister    Hypertension Daughter    Hyperlipidemia Son     Colon cancer Neg Hx    Colon polyps Neg Hx    Crohn's disease Neg Hx    Esophageal cancer Neg Hx    Rectal cancer Neg Hx    Stomach cancer Neg Hx    Ulcerative colitis Neg Hx     Social History   Socioeconomic History   Marital status: Single    Spouse name: Not on file   Number of children: Not on file   Years of education: Not on file   Highest education level: Not on file  Occupational History   Not on file  Tobacco Use   Smoking status: Never   Smokeless tobacco: Never  Vaping Use   Vaping status: Never Used  Substance and Sexual Activity   Alcohol use: No   Drug use: No   Sexual activity: Never    Birth control/protection: Surgical  Other Topics Concern   Not on file  Social History Narrative   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   3 children: Ages 26,30, 46 --all out of house   Social Drivers of Corporate investment banker Strain: Not on file  Food Insecurity: Not on file  Transportation Needs: Not on file  Physical Activity: Not on file  Stress: Not on file  Social Connections:  Not on file  Intimate Partner Violence: Not on file    Outpatient Medications Prior to Visit  Medication Sig Dispense Refill   amLODipine  (NORVASC ) 5 MG tablet Take 1 tablet (5 mg total) by mouth daily. 90 tablet 3   atorvastatin  (LIPITOR) 20 MG tablet Take 1 tablet (20 mg total) by mouth daily. 90 tablet 2   blood glucose meter kit and supplies KIT Dispense based on patient and insurance preference. Use up to four times daily as directed. 1 each 0   cetirizine  (ZYRTEC ) 10 MG tablet TAKE 1 TABLET BY MOUTH EVERY DAY 90 tablet 1   Cholecalciferol  2000 units CAPS Take 1 capsule (2,000 Units total) daily by mouth. 30 each 3   fluticasone  (FLONASE ) 50 MCG/ACT nasal spray Place 2 sprays into both nostrils daily. 16 g 2   Lancets MISC Please dispense based on patient and insurance preference. Use as directed to monitor  FSBS 1x daily. Dx: E11.9 100 each 1   Multiple Vitamins-Minerals (MULTIVITAMIN PO) Take by mouth.     olmesartan -hydrochlorothiazide  (BENICAR  HCT) 20-12.5 MG tablet Take 1 tablet by mouth daily. 90 tablet 3   amoxicillin -clavulanate (AUGMENTIN ) 875-125 MG tablet Take 1 tablet by mouth 2 (two) times daily. 14 tablet 0   metFORMIN  (GLUCOPHAGE ) 1000 MG tablet Take 1 tablet (1,000 mg total) by mouth 2 (two) times daily with a meal. 180 tablet 1   No facility-administered medications prior to visit.    Allergies  Allergen Reactions   Benazepril      Cough     ROS Review of Systems  Constitutional:  Negative for chills and fever.  HENT:  Positive for congestion. Negative for sinus pressure and sore throat.   Eyes:  Negative for pain and discharge.  Respiratory:  Negative for cough and shortness of breath.   Cardiovascular:  Negative for chest pain and palpitations.  Gastrointestinal:  Negative for abdominal pain, constipation, diarrhea, nausea and vomiting.  Endocrine: Negative for polydipsia and polyuria.  Genitourinary:  Negative for dysuria and hematuria.  Musculoskeletal:  Negative for neck pain and neck stiffness.  Skin:  Negative for rash.  Neurological:  Negative for dizziness and weakness.  Psychiatric/Behavioral:  Negative for agitation and behavioral problems.       Objective:    Physical Exam Vitals reviewed.  Constitutional:      General: She is not in acute distress.    Appearance: She is not diaphoretic.  HENT:     Head: Normocephalic and atraumatic.     Nose: Congestion present.     Mouth/Throat:     Mouth: Mucous membranes are moist.  Eyes:     General: No scleral icterus.    Extraocular Movements: Extraocular movements intact.  Cardiovascular:     Rate and Rhythm: Normal rate and regular rhythm.     Heart sounds: Normal heart sounds. No murmur heard. Pulmonary:     Breath sounds: Normal breath sounds. No wheezing or rales.  Abdominal:     Palpations:  Abdomen is soft.     Tenderness: There is no abdominal tenderness.  Musculoskeletal:     Cervical back: Neck supple. No tenderness.     Right lower leg: No edema.     Left lower leg: No edema.  Skin:    General: Skin is warm.     Findings: No rash.  Neurological:     General: No focal deficit present.     Mental Status: She is alert and oriented to person, place, and time.  Sensory: No sensory deficit.     Motor: No weakness.  Psychiatric:        Mood and Affect: Mood normal.        Behavior: Behavior normal.     BP 138/74 (BP Location: Left Arm)   Pulse 76   Ht 5' 7.5 (1.715 m)   Wt 171 lb 12.8 oz (77.9 kg)   SpO2 94%   BMI 26.51 kg/m  Wt Readings from Last 3 Encounters:  05/24/24 171 lb 12.8 oz (77.9 kg)  12/23/23 168 lb 3.2 oz (76.3 kg)  11/20/23 165 lb (74.8 kg)    Lab Results  Component Value Date   TSH 1.830 05/20/2023   Lab Results  Component Value Date   WBC 5.8 05/20/2023   HGB 13.5 05/20/2023   HCT 41.1 05/20/2023   MCV 82 05/20/2023   PLT 305 05/20/2023   Lab Results  Component Value Date   NA 146 (H) 08/22/2023   K 4.4 08/22/2023   CO2 21 08/22/2023   GLUCOSE 102 (H) 08/22/2023   BUN 11 08/22/2023   CREATININE 0.90 08/22/2023   BILITOT 0.3 05/20/2023   ALKPHOS 126 (H) 05/20/2023   AST 23 05/20/2023   ALT 27 05/20/2023   PROT 6.8 05/20/2023   ALBUMIN 4.0 05/20/2023   CALCIUM  9.6 08/22/2023   ANIONGAP 9 02/21/2020   EGFR 72 08/22/2023   Lab Results  Component Value Date   CHOL 170 05/20/2023   Lab Results  Component Value Date   HDL 52 05/20/2023   Lab Results  Component Value Date   LDLCALC 100 (H) 05/20/2023   Lab Results  Component Value Date   TRIG 101 05/20/2023   Lab Results  Component Value Date   CHOLHDL 3.3 05/20/2023   Lab Results  Component Value Date   HGBA1C 6.8 (H) 12/23/2023      Assessment & Plan:   Problem List Items Addressed This Visit       Cardiovascular and Mediastinum   Hypertension    BP Readings from Last 1 Encounters:  05/24/24 138/74   Well-controlled with amlodipine  5 mg QD and Olmesartan -hydrochlorothiazide  20-12.5 mg QD now Counseled for compliance with the medications Advised DASH diet and moderate exercise/walking, at least 150 mins/week      Relevant Orders   TSH   CMP14+EGFR   CBC with Differential/Platelet     Respiratory   Allergic rhinitis   Well-controlled with Zyrtec , refilled Has Flonase  for allergies/nasal congestion Sinus rinse as tolerated Advised to use vaporizer for nasal congestion        Endocrine   Type 2 diabetes mellitus with other specified complication (HCC)   Lab Results  Component Value Date   HGBA1C 6.8 (H) 12/23/2023   Associated with HTN and HLD Well-controlled On metformin  1000 mg BID Advised to follow diabetic diet On statin F/u CMP, HbA1c and lipid panel Diabetic eye exam: Advised to follow up with Ophthalmology for diabetic eye exam      Relevant Medications   metFORMIN  (GLUCOPHAGE ) 1000 MG tablet   Other Relevant Orders   Microalbumin / creatinine urine ratio   Hemoglobin A1c   CMP14+EGFR     Other   Vitamin D  deficiency   Relevant Orders   VITAMIN D  25 Hydroxy (Vit-D Deficiency, Fractures)   HLD (hyperlipidemia)   On Lipitor 20 mg QD Check lipid profile      Relevant Orders   Lipid panel   Encounter for general adult medical examination with abnormal findings -  Primary   Physical exam as documented. Counseling done  re healthy lifestyle involving commitment to 150 minutes exercise per week, heart healthy diet, and attaining healthy weight.The importance of adequate sleep also discussed. Changes in health habits are decided on by the patient with goals and time frames  set for achieving them. Immunization and cancer screening needs are specifically addressed at this visit.      Need for COVID-19 vaccine   Due to her chronic medical conditions, recommended and prescription provided for COVID  vaccine      Relevant Medications   COVID-19 mRNA vaccine (SPIKEVAX ) syringe   Other Visit Diagnoses       Breast cancer screening by mammogram       Relevant Orders   MM 3D SCREENING MAMMOGRAM BILATERAL BREAST     Encounter for immunization       Relevant Orders   Flu vaccine trivalent PF, 6mos and older(Flulaval,Afluria,Fluarix,Fluzone) (Completed)        Meds ordered this encounter  Medications   COVID-19 mRNA vaccine (SPIKEVAX ) syringe    Sig: Inject 0.5 mLs into the muscle once for 1 dose.    Dispense:  0.5 mL    Refill:  0   metFORMIN  (GLUCOPHAGE ) 1000 MG tablet    Sig: Take 1 tablet (1,000 mg total) by mouth 2 (two) times daily with a meal.    Dispense:  180 tablet    Refill:  1    Follow-up: Return in about 6 months (around 11/21/2024) for HTN and DM.    Suzzane MARLA Blanch, MD

## 2024-05-24 NOTE — Assessment & Plan Note (Signed)
 BP Readings from Last 1 Encounters:  05/24/24 138/74   Well-controlled with amlodipine  5 mg QD and Olmesartan -hydrochlorothiazide  20-12.5 mg QD now Counseled for compliance with the medications Advised DASH diet and moderate exercise/walking, at least 150 mins/week

## 2024-05-24 NOTE — Assessment & Plan Note (Addendum)
 On Lipitor 20 mg QD Check lipid profile

## 2024-05-24 NOTE — Addendum Note (Signed)
 Addended byBETHA TOBIE DOWNS on: 05/24/2024 08:50 AM   Modules accepted: Orders

## 2024-05-24 NOTE — Assessment & Plan Note (Signed)
 Lab Results  Component Value Date   HGBA1C 6.8 (H) 12/23/2023   Associated with HTN and HLD Well-controlled On metformin  1000 mg BID Advised to follow diabetic diet On statin F/u CMP, HbA1c and lipid panel Diabetic eye exam: Advised to follow up with Ophthalmology for diabetic eye exam

## 2024-05-24 NOTE — Assessment & Plan Note (Signed)

## 2024-05-24 NOTE — Assessment & Plan Note (Signed)
 Well-controlled with Zyrtec , refilled Has Flonase  for allergies/nasal congestion Sinus rinse as tolerated Advised to use vaporizer for nasal congestion

## 2024-05-24 NOTE — Patient Instructions (Signed)
 Please continue to take medications as prescribed.  Please continue to follow low carb diet and perform moderate exercise/walking at least 150 mins/week.

## 2024-05-24 NOTE — Assessment & Plan Note (Signed)
 Due to her chronic medical conditions, recommended and prescription provided for COVID vaccine

## 2024-05-25 ENCOUNTER — Ambulatory Visit: Payer: Self-pay | Admitting: Internal Medicine

## 2024-05-25 LAB — CMP14+EGFR
ALT: 22 IU/L (ref 0–32)
AST: 17 IU/L (ref 0–40)
Albumin: 4.1 g/dL (ref 3.9–4.9)
Alkaline Phosphatase: 136 IU/L — ABNORMAL HIGH (ref 51–125)
BUN/Creatinine Ratio: 18 (ref 12–28)
BUN: 17 mg/dL (ref 8–27)
Bilirubin Total: 0.3 mg/dL (ref 0.0–1.2)
CO2: 23 mmol/L (ref 20–29)
Calcium: 9.7 mg/dL (ref 8.7–10.3)
Chloride: 105 mmol/L (ref 96–106)
Creatinine, Ser: 0.94 mg/dL (ref 0.57–1.00)
Globulin, Total: 2.8 g/dL (ref 1.5–4.5)
Glucose: 125 mg/dL — ABNORMAL HIGH (ref 70–99)
Potassium: 4.4 mmol/L (ref 3.5–5.2)
Sodium: 142 mmol/L (ref 134–144)
Total Protein: 6.9 g/dL (ref 6.0–8.5)
eGFR: 68 mL/min/1.73 (ref >59)

## 2024-05-25 LAB — LIPID PANEL
Chol/HDL Ratio: 3.3 ratio (ref 0.0–4.4)
Cholesterol, Total: 175 mg/dL (ref 100–199)
HDL: 53 mg/dL (ref >39)
LDL Chol Calc (NIH): 95 mg/dL (ref 0–99)
Triglycerides: 154 mg/dL — ABNORMAL HIGH (ref 0–149)
VLDL Cholesterol Cal: 27 mg/dL (ref 5–40)

## 2024-05-25 LAB — CBC WITH DIFFERENTIAL/PLATELET
Basophils Absolute: 0 x10E3/uL (ref 0.0–0.2)
Basos: 1 %
EOS (ABSOLUTE): 0 x10E3/uL (ref 0.0–0.4)
Eos: 1 %
Hematocrit: 44.7 % (ref 34.0–46.6)
Hemoglobin: 13.9 g/dL (ref 11.1–15.9)
Immature Grans (Abs): 0 x10E3/uL (ref 0.0–0.1)
Immature Granulocytes: 0 %
Lymphocytes Absolute: 2.4 x10E3/uL (ref 0.7–3.1)
Lymphs: 37 %
MCH: 27.4 pg (ref 26.6–33.0)
MCHC: 31.1 g/dL — ABNORMAL LOW (ref 31.5–35.7)
MCV: 88 fL (ref 79–97)
Monocytes Absolute: 0.5 x10E3/uL (ref 0.1–0.9)
Monocytes: 8 %
Neutrophils Absolute: 3.5 x10E3/uL (ref 1.4–7.0)
Neutrophils: 53 %
Platelets: 294 x10E3/uL (ref 150–450)
RBC: 5.07 x10E6/uL (ref 3.77–5.28)
RDW: 13.5 % (ref 11.7–15.4)
WBC: 6.5 x10E3/uL (ref 3.4–10.8)

## 2024-05-25 LAB — VITAMIN D 25 HYDROXY (VIT D DEFICIENCY, FRACTURES): Vit D, 25-Hydroxy: 42.4 ng/mL (ref 30.0–100.0)

## 2024-05-25 LAB — HEMOGLOBIN A1C
Est. average glucose Bld gHb Est-mCnc: 166 mg/dL
Hgb A1c MFr Bld: 7.4 % — ABNORMAL HIGH (ref 4.8–5.6)

## 2024-05-25 LAB — TSH: TSH: 1.98 u[IU]/mL (ref 0.450–4.500)

## 2024-05-26 LAB — MICROALBUMIN / CREATININE URINE RATIO
Creatinine, Urine: 142.6 mg/dL
Microalb/Creat Ratio: 3 mg/g{creat} (ref 0–29)
Microalbumin, Urine: 4.4 ug/mL

## 2024-06-01 ENCOUNTER — Telehealth: Payer: Self-pay | Admitting: Internal Medicine

## 2024-06-01 LAB — HM DIABETES EYE EXAM

## 2024-06-01 NOTE — Telephone Encounter (Signed)
 Patient came by the office to give her eye care information so Dr Tobie can fax over all list of her meds to [facility] Dr Willma Moats, Optometrist  phone # [phone] and fax # [phone].  Patient said she is allergic to some type of blood pressure medicine that made her cough can't remember the name of it., was given to nurse need to put on list as well.

## 2024-06-02 NOTE — Telephone Encounter (Signed)
Med list and allergy list faxed.

## 2024-06-07 ENCOUNTER — Encounter: Payer: Self-pay | Admitting: Internal Medicine

## 2024-07-01 ENCOUNTER — Ambulatory Visit (HOSPITAL_COMMUNITY)
Admission: RE | Admit: 2024-07-01 | Discharge: 2024-07-01 | Disposition: A | Discharge status: Home / Self Care | Source: Ambulatory Visit | Attending: Internal Medicine | Admitting: Internal Medicine

## 2024-07-01 ENCOUNTER — Encounter (HOSPITAL_COMMUNITY): Payer: Self-pay

## 2024-07-01 DIAGNOSIS — Z1231 Encounter for screening mammogram for malignant neoplasm of breast: Secondary | ICD-10-CM | POA: Insufficient documentation

## 2024-07-26 ENCOUNTER — Other Ambulatory Visit: Payer: Self-pay | Admitting: Internal Medicine

## 2024-07-26 DIAGNOSIS — J301 Allergic rhinitis due to pollen: Secondary | ICD-10-CM

## 2024-08-16 ENCOUNTER — Ambulatory Visit: Admitting: Internal Medicine

## 2024-08-16 ENCOUNTER — Encounter: Payer: Self-pay | Admitting: Internal Medicine

## 2024-08-16 VITALS — BP 155/80 | HR 76 | Ht 67.5 in | Wt 179.4 lb

## 2024-08-16 DIAGNOSIS — I10 Essential (primary) hypertension: Secondary | ICD-10-CM

## 2024-08-16 DIAGNOSIS — E1169 Type 2 diabetes mellitus with other specified complication: Secondary | ICD-10-CM | POA: Diagnosis not present

## 2024-08-16 DIAGNOSIS — E782 Mixed hyperlipidemia: Secondary | ICD-10-CM

## 2024-08-16 DIAGNOSIS — J0110 Acute frontal sinusitis, unspecified: Secondary | ICD-10-CM

## 2024-08-16 DIAGNOSIS — Z7984 Long term (current) use of oral hypoglycemic drugs: Secondary | ICD-10-CM | POA: Diagnosis not present

## 2024-08-16 MED ORDER — ATORVASTATIN CALCIUM 20 MG PO TABS: 20.0000 mg | ORAL_TABLET | Freq: Every day | ORAL | 3 refills | Status: AC

## 2024-08-16 MED ORDER — AMOXICILLIN-POT CLAVULANATE 875-125 MG PO TABS: 1.0000 | ORAL_TABLET | Freq: Two times a day (BID) | ORAL | 0 refills | Status: AC

## 2024-08-16 MED ORDER — OLMESARTAN MEDOXOMIL-HCTZ 20-12.5 MG PO TABS: 1.0000 | ORAL_TABLET | Freq: Every day | ORAL | 3 refills | Status: AC

## 2024-08-16 NOTE — Patient Instructions (Signed)
 Please start taking Augmentin  after 3 days if your symptoms do not improve.  Please continue to take other medications as prescribed.  Please continue to follow low carb diet and perform moderate exercise/walking at least 150 mins/week.

## 2024-08-16 NOTE — Assessment & Plan Note (Signed)
 Although most of her symptoms are related to allergies, due to recent worsening of symptoms despite symptomatic treatment, started empiric Augmentin Flonase for nasal congestion

## 2024-08-16 NOTE — Assessment & Plan Note (Signed)
 Lab Results  Component Value Date   HGBA1C 7.4 (H) 05/24/2024   Associated with HTN and HLD Well-controlled, but recently worse - check HbA1c after 1 month On metformin  1000 mg BID Advised to follow diabetic diet On statin F/u CMP, HbA1c and lipid panel Diabetic eye exam: Advised to follow up with Ophthalmology for diabetic eye exam

## 2024-08-16 NOTE — Assessment & Plan Note (Signed)
 BP Readings from Last 1 Encounters:  08/16/24 (!) 155/80   Usually well-controlled with amlodipine  5 mg QD and Olmesartan -hydrochlorothiazide  20-12.5 mg QD now Counseled for compliance with the medications Advised DASH diet and moderate exercise/walking, at least 150 mins/week

## 2024-08-16 NOTE — Assessment & Plan Note (Signed)
 On Lipitor 20 mg QD Checked lipid profile - needs to remain compliant to Lipitor

## 2024-08-16 NOTE — Progress Notes (Unsigned)
 Established Patient Office Visit  Subjective:  Patient ID: Amber Boone, female    DOB: 10-02-1960  Age: 63 y.o. MRN: [id number]  CC:  Chief Complaint  Patient presents with   Follow-up    4 month f/u     HPI Amber Boone is a 63 y.o. female with past medical history of HTN, type II DM, HLD and allergic rhinitis who presents for f/u of her chronic medical conditions.  HTN: BP is well-controlled. She has been taking olmesartan -HCTZ and amlodipine  5 mg QD regularly. Patient denies headache, dizziness, chest pain, dyspnea or palpitations.  Type II DM with HLD: Her HbA1c was 6.8 in 04/25. She has been taking Metformin  1000 mg BID now. She denies any fatigue, polyuria or polyphagia currently.  She has been trying to follow low-carb diet.  She is also on atorvastatin  for HLD.  She takes Zyrtec  for allergic rhinitis.  She currently denies any dyspnea or wheezing. Denies any dyspnea or wheezing currently.     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 The overall lesion appears to be a hamartoma   BREAST LUMPECTOMY WITH RADIOACTIVE SEED LOCALIZATION Left 02/24/2020   Procedure: LEFT BREAST LUMPECTOMY WITH RADIOACTIVE SEED LOCALIZATION;  Surgeon: Vanderbilt Ned, MD;  Location: [facility];  Service: General;  Laterality: Left;   TOTAL ABDOMINAL HYSTERECTOMY     Secondary to fibroids-No cancer   TUBAL LIGATION      Family History  Problem Relation Age of Onset   Hypertension Mother    Cancer Father        ? Type   Hypertension Sister    Hypertension Sister    Hypertension Daughter    Hyperlipidemia  Son    Colon cancer Neg Hx    Colon polyps Neg Hx    Crohn's disease Neg Hx    Esophageal cancer Neg Hx    Rectal cancer Neg Hx    Stomach cancer Neg Hx    Ulcerative colitis Neg Hx     Social History   Socioeconomic History   Marital status: Single    Spouse name: Not on file   Number of children: Not on file   Years of education: Not on file   Highest education level: Not on file  Occupational History   Not on file  Tobacco Use   Smoking status: Never   Smokeless tobacco: Never  Vaping Use   Vaping status: Never Used  Substance and Sexual Activity   Alcohol use: No   Drug use: No   Sexual activity: Never    Birth control/protection: Surgical  Other Topics Concern   Not on file  Social History Narrative   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   3 children: Ages 71,30, 58 --all out of house   Social Drivers of Corporate Investment Banker Strain: Not on file  Food Insecurity: Not on file  Transportation Needs: Not on file  Physical Activity: Not on file  Stress: Not on file  Social Connections: Not on file  Intimate Partner Violence: Not on file    Outpatient Medications Prior to Visit  Medication Sig Dispense Refill   amLODipine  (NORVASC ) 5 MG tablet Take 1 tablet (5 mg total) by mouth daily. 90 tablet 3   blood glucose meter kit and supplies KIT Dispense based on patient and insurance preference. Use up to four times daily as directed. 1 each 0   cetirizine  (ZYRTEC ) 10 MG tablet TAKE 1 TABLET BY MOUTH EVERY DAY 90 tablet 1   Cholecalciferol  2000 units CAPS Take 1 capsule (2,000 Units total) daily by mouth. 30 each 3   fluticasone  (FLONASE ) 50 MCG/ACT nasal spray SPRAY 2 SPRAYS INTO EACH NOSTRIL EVERY DAY 16 mL 2   Lancets MISC Please dispense based on patient and insurance preference. Use as directed to monitor FSBS 1x daily. Dx: E11.9 100 each 1   metFORMIN  (GLUCOPHAGE ) 1000 MG tablet Take 1  tablet (1,000 mg total) by mouth 2 (two) times daily with a meal. 180 tablet 1   Multiple Vitamins-Minerals (MULTIVITAMIN PO) Take by mouth.     atorvastatin  (LIPITOR) 20 MG tablet Take 1 tablet (20 mg total) by mouth daily. 90 tablet 2   olmesartan -hydrochlorothiazide  (BENICAR  HCT) 20-12.5 MG tablet Take 1 tablet by mouth daily. 90 tablet 3   No facility-administered medications prior to visit.    Allergies  Allergen Reactions   Benazepril      Cough     ROS Review of Systems  Constitutional:  Negative for chills and fever.  HENT:  Positive for congestion. Negative for sinus pressure and sore throat.   Eyes:  Negative for pain and discharge.  Respiratory:  Negative for cough and shortness of breath.   Cardiovascular:  Negative for chest pain and palpitations.  Gastrointestinal:  Negative for abdominal pain, constipation, diarrhea, nausea and vomiting.  Endocrine: Negative for polydipsia and polyuria.  Genitourinary:  Negative for dysuria and hematuria.  Musculoskeletal:  Negative for neck pain and neck stiffness.  Skin:  Negative for rash.  Neurological:  Negative for dizziness and weakness.  Psychiatric/Behavioral:  Negative for agitation and behavioral problems.       Objective:    Physical Exam Vitals reviewed.  Constitutional:      General: She is not in acute distress.    Appearance: She is not diaphoretic.  HENT:     Head: Normocephalic and atraumatic.     Nose: Congestion present.     Mouth/Throat:     Mouth: Mucous membranes are moist.  Eyes:     General: No scleral icterus.    Extraocular Movements: Extraocular movements intact.  Cardiovascular:     Rate and Rhythm: Normal rate and regular rhythm.     Heart sounds: Normal heart sounds. No murmur heard. Pulmonary:     Breath sounds: Normal breath sounds. No wheezing or rales.  Abdominal:     Palpations: Abdomen is soft.     Tenderness: There is no abdominal tenderness.  Musculoskeletal:     Cervical  back: Neck supple. No tenderness.     Right lower leg: No edema.     Left lower leg: No edema.  Skin:    General: Skin is warm.     Findings: No rash.  Neurological:     General: No focal deficit present.     Mental Status: She is alert and oriented to person, place, and time.     Sensory: No sensory deficit.     Motor: No weakness.  Psychiatric:  Mood and Affect: Mood normal.        Behavior: Behavior normal.     BP (!) 155/80 (BP Location: Left Arm)   Pulse 76   Ht 5' 7.5 (1.715 m)   Wt 179 lb 6.4 oz (81.4 kg)   SpO2 99%   BMI 27.68 kg/m  Wt Readings from Last 3 Encounters:  08/16/24 179 lb 6.4 oz (81.4 kg)  05/24/24 171 lb 12.8 oz (77.9 kg)  12/23/23 168 lb 3.2 oz (76.3 kg)    Lab Results  Component Value Date   TSH 1.980 05/24/2024   Lab Results  Component Value Date   WBC 6.5 05/24/2024   HGB 13.9 05/24/2024   HCT 44.7 05/24/2024   MCV 88 05/24/2024   PLT 294 05/24/2024   Lab Results  Component Value Date   NA 142 05/24/2024   K 4.4 05/24/2024   CO2 23 05/24/2024   GLUCOSE 125 (H) 05/24/2024   BUN 17 05/24/2024   CREATININE 0.94 05/24/2024   BILITOT 0.3 05/24/2024   ALKPHOS 136 (H) 05/24/2024   AST 17 05/24/2024   ALT 22 05/24/2024   PROT 6.9 05/24/2024   ALBUMIN 4.1 05/24/2024   CALCIUM  9.7 05/24/2024   ANIONGAP 9 02/21/2020   EGFR 68 05/24/2024   Lab Results  Component Value Date   CHOL 175 05/24/2024   Lab Results  Component Value Date   HDL 53 05/24/2024   Lab Results  Component Value Date   LDLCALC 95 05/24/2024   Lab Results  Component Value Date   TRIG 154 (H) 05/24/2024   Lab Results  Component Value Date   CHOLHDL 3.3 05/24/2024   Lab Results  Component Value Date   HGBA1C 7.4 (H) 05/24/2024      Assessment & Plan:   Problem List Items Addressed This Visit       Cardiovascular and Mediastinum   Hypertension   BP Readings from Last 1 Encounters:  08/16/24 (!) 155/80   Usually well-controlled with  amlodipine  5 mg QD and Olmesartan -hydrochlorothiazide  20-12.5 mg QD now Counseled for compliance with the medications Advised DASH diet and moderate exercise/walking, at least 150 mins/week      Relevant Medications   atorvastatin  (LIPITOR) 20 MG tablet   olmesartan -hydrochlorothiazide  (BENICAR  HCT) 20-12.5 MG tablet     Respiratory   Acute non-recurrent frontal sinusitis   Although most of her symptoms are related to allergies, due to recent worsening of symptoms despite symptomatic treatment, started empiric Augmentin  Flonase  for nasal congestion      Relevant Medications   amoxicillin -clavulanate (AUGMENTIN ) 875-125 MG tablet     Endocrine   Type 2 diabetes mellitus with other specified complication (HCC) - Primary   Lab Results  Component Value Date   HGBA1C 7.4 (H) 05/24/2024   Associated with HTN and HLD Well-controlled On metformin  1000 mg BID Advised to follow diabetic diet On statin F/u CMP, HbA1c and lipid panel Diabetic eye exam: Advised to follow up with Ophthalmology for diabetic eye exam      Relevant Medications   atorvastatin  (LIPITOR) 20 MG tablet   olmesartan -hydrochlorothiazide  (BENICAR  HCT) 20-12.5 MG tablet   Other Relevant Orders   CMP14+EGFR   Hemoglobin A1c     Other   HLD (hyperlipidemia)   On Lipitor 20 mg QD Checked lipid profile - needs to remain compliant to Lipitor      Relevant Medications   atorvastatin  (LIPITOR) 20 MG tablet   olmesartan -hydrochlorothiazide  (BENICAR  HCT) 20-12.5 MG tablet  Meds ordered this encounter  Medications   atorvastatin  (LIPITOR) 20 MG tablet    Sig: Take 1 tablet (20 mg total) by mouth daily.    Dispense:  90 tablet    Refill:  3   olmesartan -hydrochlorothiazide  (BENICAR  HCT) 20-12.5 MG tablet    Sig: Take 1 tablet by mouth daily.    Dispense:  90 tablet    Refill:  3   amoxicillin -clavulanate (AUGMENTIN ) 875-125 MG tablet    Sig: Take 1 tablet by mouth 2 (two) times daily.    Dispense:  14  tablet    Refill:  0    Follow-up: Return in about 4 months (around 12/15/2024) for DM and HTN.    Suzzane MARLA Blanch, MD

## 2024-12-16 ENCOUNTER — Ambulatory Visit: Admitting: Internal Medicine
